# Patient Record
Sex: Male | Born: 1960 | Race: White | Hispanic: No | Marital: Married | Smoking: Current every day smoker
Health system: Southern US, Community
[De-identification: ages and names within clinical notes are randomized; demographics above are authoritative.]

## PROBLEM LIST (undated history)

## (undated) DIAGNOSIS — H579 Unspecified disorder of eye and adnexa: Secondary | ICD-10-CM

## (undated) DIAGNOSIS — I6529 Occlusion and stenosis of unspecified carotid artery: Secondary | ICD-10-CM

## (undated) DIAGNOSIS — R569 Unspecified convulsions: Secondary | ICD-10-CM

## (undated) DIAGNOSIS — E119 Type 2 diabetes mellitus without complications: Secondary | ICD-10-CM

## (undated) HISTORY — DX: Unspecified disorder of eye and adnexa: H57.9

## (undated) HISTORY — DX: Occlusion and stenosis of unspecified carotid artery: I65.29

## (undated) HISTORY — DX: Type 2 diabetes mellitus without complications: E11.9

## (undated) HISTORY — DX: Unspecified convulsions: R56.9

## (undated) HISTORY — PX: OTHER SURGICAL HISTORY: SHX169

---

## 2004-11-05 ENCOUNTER — Encounter: Admission: RE | Admit: 2004-11-05 | Discharge: 2004-11-05 | Payer: Self-pay | Admitting: Vascular Surgery

## 2015-02-07 ENCOUNTER — Other Ambulatory Visit: Payer: Self-pay | Admitting: Diagnostic Neuroimaging

## 2015-02-07 ENCOUNTER — Ambulatory Visit (INDEPENDENT_AMBULATORY_CARE_PROVIDER_SITE_OTHER): Payer: 59 | Admitting: Diagnostic Neuroimaging

## 2015-02-07 ENCOUNTER — Encounter: Payer: Self-pay | Admitting: Diagnostic Neuroimaging

## 2015-02-07 VITALS — BP 151/101 | HR 100 | Ht 76.0 in | Wt 194.6 lb

## 2015-02-07 DIAGNOSIS — R569 Unspecified convulsions: Secondary | ICD-10-CM | POA: Diagnosis not present

## 2015-02-07 DIAGNOSIS — Z139 Encounter for screening, unspecified: Secondary | ICD-10-CM

## 2015-02-07 MED ORDER — LEVETIRACETAM 500 MG PO TABS: 500.0000 mg | ORAL_TABLET | Freq: Two times a day (BID) | ORAL | Status: DC

## 2015-02-07 NOTE — Progress Notes (Signed)
[facility]  PATIENT: Michael Patel DOB: 1960-09-16  REFERRING CLINICIAN: Vyas HISTORY FROM: patient and wife  REASON FOR VISIT: new consult     HISTORICAL  CHIEF COMPLAINT:  Chief Complaint  Patient presents with  . New Evaluation    tonic clonic seizures     HISTORY OF PRESENT ILLNESS:   54 year old right-handed male here for evaluation of seizure. Patient has regular alcohol use, 2-3 drinks per night, 4-5 times per week for several years. He also uses lorazepam regularly for several years for anxiety.  01/26/15, patient was sitting at home, then reported he was "not feeling well" his wife. He does not recall saying this. He then had generalized shaking convulsions, was partially responsive, lasting for 5 minutes and then stop. Within a few minutes he had a second episode lasting 10 minutes of generalized convulsions. Patient had saliva and foam from the mouth, tongue biting, unresponsiveness. Afterwards he was confused and sleepy. Paramedics were called his home and took him to the hospital. Patient does not remember going to the hospital. He remembers waking up in the hospital and getting admitted.  Patient had CT of the head, CT imaging of the chest, blood testing, and was discharged on Dilantin 100 mg 3 times per day.  Since then patient has had no further events. Patient does have history of head trauma in junior high school, when he fell on concrete, loss of consciousness, admitted to the hospital for several days. No family history of seizure. No prior similar events. No CNS infections.  REVIEW OF SYSTEMS: Full 14 system review of systems performed and notable only for only as per history of present illness.   ALLERGIES: Allergies not on file  HOME MEDICATIONS: No outpatient prescriptions prior to visit.   No facility-administered medications prior to visit.    PAST MEDICAL HISTORY: Past Medical History  Diagnosis Date  . Other eye problems  influencing health status     r/t plaque breaking off of right ICA blockage  . Carotid artery calcification     right ICA 100% blockage  . Type 2 diabetes mellitus     controlled    PAST SURGICAL HISTORY: Past Surgical History  Procedure Laterality Date  . Left total hip replacement    . Left humorous fracture      FAMILY HISTORY: Family History  Problem Relation Age of Onset  . Diabetes type II Mother   . Diabetes Mellitus II Maternal Grandmother   . Diabetes Mellitus II Sister   . Cancer Mother   . Other Father     brain tumor    SOCIAL HISTORY:  History   Social History  . Marital Status: Married    Spouse Name: Burna Mortimer  . Number of Children: 1  . Years of Education: college   Occupational History  . unable to work    Social History Main Topics  . Smoking status: Current Every Day Smoker -- 1.00 packs/day    Types: Cigarettes  . Smokeless tobacco: Not on file  . Alcohol Use: 8.4 - 12.6 oz/week    14-21 Standard drinks or equivalent per week     Comment: 2-3 a night  . Drug Use: No  . Sexual Activity: Not on file   Other Topics Concern  . Not on file   Social History Narrative   Lives at home with his wife Burna Mortimer   Has one child from a previous marriage   Occasionally drinks coffee  PHYSICAL EXAM  Filed Vitals:   02/07/15 0921  Height: 6\' 4"  (1.93 m)  Weight: 194 lb 9.6 oz (88.27 kg)    Body mass index is 23.7 kg/(m^2).  No exam data present  No flowsheet data found.   GENERAL EXAM: Patient is in no distress; well developed, nourished and groomed; neck is supple; STRONG CIGARETTE SMOKE SMELL  CARDIOVASCULAR: Regular rate and rhythm, no murmurs, no carotid bruits  NEUROLOGIC: MENTAL STATUS: awake, alert, oriented to person, place and time, recent and remote memory intact, normal attention and concentration, language fluent, comprehension intact, naming intact, fund of knowledge appropriate CRANIAL NERVE: no papilledema on fundoscopic  exam, pupils equal and reactive to light, visual fields full to confrontation, extraocular muscles intact, no nystagmus, facial sensation and strength symmetric, hearing intact, palate elevates symmetrically, uvula midline, shoulder shrug symmetric, tongue midline. MOTOR: normal bulk and tone, full strength in the BUE, BLE; LEFT LEG WEAKNESS (LEFT HIP FLEX 1-2; LEFT KNEE FLEX/EXT 3) SENSORY: normal and symmetric to light touch, temperature, vibration  COORDINATION: finger-nose-finger, fine finger movements, heel-shin normal REFLEXES: deep tendon reflexes present and symmetric GAIT/STATION: narrow based gait; able to walk on toes, heels and tandem; romberg is negative    DIAGNOSTIC DATA (LABS, IMAGING, TESTING) - I reviewed patient records, labs, notes, testing and imaging myself where available.  No results found for: WBC, HGB, HCT, MCV, PLT No results found for: NA, K, CL, CO2, GLUCOSE, BUN, CREATININE, CALCIUM, PROT, ALBUMIN, AST, ALT, ALKPHOS, BILITOT, GFRNONAA, GFRAA No results found for: CHOL, HDL, LDLCALC, LDLDIRECT, TRIG, CHOLHDL No results found for: [id number] No results found for: VITAMINB12 No results found for: TSH   Labs [facility]): EtOH <10, salicylate < 3, tylenol < 5, D-dimer 0.88  01/26/15 CT head - no acute findings; mild atrophy; right frontal cortical hypodensity (? Chronic ischemic gliosis) [I reviewed images myself, and these are my findings. -VRP]    ASSESSMENT AND PLAN  54 y.o. year old male here with  New onset seizure x 2 in Jan 26, 2015. No clear triggering factors. Will complete workup. Also will transition dilantin to newer generation anti-sz med (levetiracetam) with less side effects and drug interactions.   Ddx: seizure disorder, stroke, neoplastic, medication/ETOH associated  PLAN: - MRI brain  - EEG - transition dilantin to levetiracetam  Patient Instructions  Start levetiracetam 500mg  twice a day. Continue this medication going forward.  After  1 week, reduce phenytoin to 100mg  twice a day.  After 1 more week, reduce phenytoin to 100mg  once a day.  After 1 more week, stop phenytoin.    Orders Placed This Encounter  Procedures  . MR Brain W Wo Contrast  . EEG adult   Meds ordered this encounter  Medications  . DISCONTD: DILANTIN 100 MG ER capsule    Sig:   . levETIRAcetam (KEPPRA) 500 MG tablet    Sig: Take 1 tablet (500 mg total) by mouth 2 (two) times daily.    Dispense:  60 tablet    Refill:  12   Return in about 3 months (around 05/10/2015).  I reviewed images, labs, notes, records myself. I summarized findings and reviewed with patient, for this high risk condition (new onset seizure disorder) requiring high complexity decision making.   Suanne MarkerVIKRAM R. Coletta Lockner, MD 02/07/2015, 10:33 AM Certified in Neurology, Neurophysiology and Neuroimaging  [facility] [address], [address] [place], [place] [address] [phone]

## 2015-02-07 NOTE — Patient Instructions (Signed)
Start levetiracetam 500mg  twice a day. Continue this medication going forward.  After 1 week, reduce phenytoin to 100mg  twice a day.  After 1 more week, reduce phenytoin to 100mg  once a day.  After 1 more week, stop phenytoin.

## 2015-02-14 ENCOUNTER — Ambulatory Visit (INDEPENDENT_AMBULATORY_CARE_PROVIDER_SITE_OTHER): Payer: 59 | Admitting: Diagnostic Neuroimaging

## 2015-02-14 DIAGNOSIS — R569 Unspecified convulsions: Secondary | ICD-10-CM

## 2015-02-15 NOTE — Procedures (Signed)
   [facility]  EEG (ELECTROENCEPHALOGRAM) REPORT   STUDY DATE: 02/14/15 PATIENT NAME: Michael Patel DOB: [birth year] MRN: [id number]  ORDERING CLINICIAN: Joycelyn SchmidVikram Shali Vesey, MD   TECHNOLOGIST: Gearldine ShownLorraine Jones  TECHNIQUE: Electroencephalogram was recorded utilizing standard 10-20 system of lead placement and reformatted into average and bipolar montages.  RECORDING TIME: 35 minutes ACTIVATION: hyperventilation and photic stimulation  CLINICAL INFORMATION: 54 year old male with new onset seizure.  FINDINGS: Background rhythms of 8-9 hertz and 30-40 microvolts. No focal, lateralizing, epileptiform activity or seizures are seen. Patient recorded in the awake state.   IMPRESSION:  Normal EEG in the awake state.    INTERPRETING PHYSICIAN:  Suanne MarkerVIKRAM R. Glendy Barsanti, MD Certified in Neurology, Neurophysiology and Neuroimaging  [facility] [address], [address] [place], [place] [address] [phone]

## 2015-02-17 ENCOUNTER — Telehealth: Payer: Self-pay | Admitting: *Deleted

## 2015-02-17 NOTE — Telephone Encounter (Signed)
Spoke to the pt on the phone and informed him that his EEG was normal. He stated an understanding and a thanks

## 2015-02-20 ENCOUNTER — Ambulatory Visit
Admission: RE | Admit: 2015-02-20 | Discharge: 2015-02-20 | Disposition: A | Discharge status: Home / Self Care | Payer: 59 | Source: Ambulatory Visit | Attending: Diagnostic Neuroimaging | Admitting: Diagnostic Neuroimaging

## 2015-02-20 DIAGNOSIS — Z139 Encounter for screening, unspecified: Secondary | ICD-10-CM

## 2015-02-20 DIAGNOSIS — R569 Unspecified convulsions: Secondary | ICD-10-CM | POA: Diagnosis not present

## 2015-02-20 MED ORDER — GADOBENATE DIMEGLUMINE 529 MG/ML IV SOLN: 18.0000 mL | Freq: Once | INTRAVENOUS | Status: AC | PRN

## 2015-05-10 ENCOUNTER — Ambulatory Visit (INDEPENDENT_AMBULATORY_CARE_PROVIDER_SITE_OTHER): Payer: 59 | Admitting: Diagnostic Neuroimaging

## 2015-05-10 ENCOUNTER — Encounter: Payer: Self-pay | Admitting: Diagnostic Neuroimaging

## 2015-05-10 VITALS — BP 132/78 | HR 117 | Ht 76.0 in | Wt 197.4 lb

## 2015-05-10 DIAGNOSIS — R569 Unspecified convulsions: Secondary | ICD-10-CM

## 2015-05-10 MED ORDER — LEVETIRACETAM 500 MG PO TABS: 500.0000 mg | ORAL_TABLET | Freq: Two times a day (BID) | ORAL | Status: AC

## 2015-05-10 NOTE — Progress Notes (Signed)
[facility]  PATIENT: Michael Patel DOB: [birth year]  REFERRING CLINICIAN: Vyas HISTORY FROM: patient and wife  REASON FOR VISIT: follow up    HISTORICAL  CHIEF COMPLAINT:  Chief Complaint  Patient presents with  . Convulsions    rm 6  . Follow-up    3 month    HISTORY OF PRESENT ILLNESS:   UPDATE 05/10/15: Since last visit, no seizures. Tolerating LEV 500mg  BID. Off dilantin. Still smoking and drinking etoh on daily basis.  PRIOR HPI (02/07/15): 54 year old right-handed male here for evaluation of seizure. Patient has regular alcohol use, 2-3 drinks per night, 4-5 times per week for several years. He also uses lorazepam regularly for several years for anxiety. 01/26/15, patient was sitting at home, then reported he was "not feeling well" his wife. He does not recall saying this. He then had generalized shaking convulsions, was partially responsive, lasting for 5 minutes and then stop. Within a few minutes he had a second episode lasting 10 minutes of generalized convulsions. Patient had saliva and foam from the mouth, tongue biting, unresponsiveness. Afterwards he was confused and sleepy. Paramedics were called his home and took him to the hospital. Patient does not remember going to the hospital. He remembers waking up in the hospital and getting admitted. Patient had CT of the head, CT imaging of the chest, blood testing, and was discharged on Dilantin 100 mg 3 times per day. Since then patient has had no further events. Patient does have history of head trauma in junior high school, when he fell on concrete, loss of consciousness, admitted to the hospital for several days. No family history of seizure. No prior similar events. No CNS infections.  REVIEW OF SYSTEMS: Full 14 system review of systems performed and notable only for only as per history of present illness.   ALLERGIES: No Known Allergies  HOME MEDICATIONS: Outpatient Prescriptions Prior to Visit    Medication Sig Dispense Refill  . aspirin 81 MG tablet Take 81 mg by mouth daily.    . benazepril (LOTENSIN) 40 MG tablet Take 40 mg by mouth daily.    . citalopram (CELEXA) 10 MG tablet     . LORazepam (ATIVAN) 1 MG tablet     . omeprazole (PRILOSEC) 20 MG capsule     . levETIRAcetam (KEPPRA) 500 MG tablet Take 1 tablet (500 mg total) by mouth 2 (two) times daily. 60 tablet 12  . NORCO 7.5-325 MG per tablet      No facility-administered medications prior to visit.    PAST MEDICAL HISTORY: Past Medical History  Diagnosis Date  . Other eye problems influencing health status     r/t plaque breaking off of right ICA blockage  . Carotid artery calcification     right ICA 100% blockage  . Type 2 diabetes mellitus     controlled  . Seizures     01/26/15    PAST SURGICAL HISTORY: Past Surgical History  Procedure Laterality Date  . Left total hip replacement    . Left humorous fracture      FAMILY HISTORY: Family History  Problem Relation Age of Onset  . Diabetes type II Mother   . Cancer Mother   . Diabetes Mellitus II Maternal Grandmother   . Diabetes Mellitus II Sister   . Cancer Sister   . Other Father     brain tumor    SOCIAL HISTORY:  Social History   Social History  . Marital Status: Married  Spouse Name: Burna Mortimer  . Number of Children: 1  . Years of Education: college   Occupational History  . unable to work    Social History Main Topics  . Smoking status: Current Every Day Smoker -- 1.00 packs/day for 41 years    Types: Cigarettes  . Smokeless tobacco: Not on file  . Alcohol Use: 8.4 - 12.6 oz/week    14-21 Standard drinks or equivalent per week     Comment: 2-3 a night  . Drug Use: No  . Sexual Activity: Not on file   Other Topics Concern  . Not on file   Social History Narrative   Lives at home with his wife Burna Mortimer   Has one child from a previous marriage   Occasionally drinks coffee     PHYSICAL EXAM  Filed Vitals:   05/10/15 1008   BP: 132/78  Pulse: 117  Height: 6\' 4"  (1.93 m)  Weight: 197 lb 6.4 oz (89.54 kg)    Body mass index is 24.04 kg/(m^2).  No exam data present  No flowsheet data found.   GENERAL EXAM: Patient is in no distress; well developed, nourished and groomed; neck is supple; STRONG CIGARETTE SMOKE SMELL  CARDIOVASCULAR: Regular rate and rhythm, no murmurs, no carotid bruits  NEUROLOGIC: MENTAL STATUS: awake, alert, language fluent, comprehension intact, naming intact, fund of knowledge appropriate CRANIAL NERVE: pupils equal and reactive to light, visual fields full to confrontation, extraocular muscles intact, no nystagmus, facial sensation and strength symmetric, hearing intact, palate elevates symmetrically, uvula midline, shoulder shrug symmetric, tongue midline. MOTOR: normal bulk and tone, full strength in the BUE, BLE; LEFT LEG WEAKNESS (LEFT HIP FLEX 1-2; LEFT KNEE FLEX/EXT 3) SENSORY: normal and symmetric to light touch, temperature, vibration  COORDINATION: finger-nose-finger, fine finger movements, heel-shin normal REFLEXES: deep tendon reflexes present and symmetric GAIT/STATION: LEFT HEMIPARETIC GAIT    DIAGNOSTIC DATA (LABS, IMAGING, TESTING) - I reviewed patient records, labs, notes, testing and imaging myself where available.  No results found for: WBC, HGB, HCT, MCV, PLT No results found for: NA, K, CL, CO2, GLUCOSE, BUN, CREATININE, CALCIUM, PROT, ALBUMIN, AST, ALT, ALKPHOS, BILITOT, GFRNONAA, GFRAA No results found for: CHOL, HDL, LDLCALC, LDLDIRECT, TRIG, CHOLHDL No results found for: [id number] No results found for: VITAMINB12 No results found for: TSH   Labs [facility]): EtOH <10, salicylate < 3, tylenol < 5, D-dimer 0.88  01/26/15 CT head - no acute findings; mild atrophy; right frontal cortical hypodensity (? Chronic ischemic gliosis) [I reviewed images myself, and these are my findings. -VRP]  02/20/15 MRI of the brain with and without contrast show the  following: 1. Remote right parietal stroke associated with encephalomalacia and mild enlargement of the posterior horn of the right lateral ventricle. This stroke is potentially in a watershed distribution. 2. There appears to be occlusion of the right internal carotid artery with no flow void noted in the right carotid siphon.  3. Mild generalized cortical atrophy and minimal chronic small vessel ischemic changes.    ASSESSMENT AND PLAN  54 y.o. year old male here with new onset seizure x 2 in Jan 26, 2015. Likely related to prior stroke and current etoh abuse. Now stable on LEV 500mg  BID.  Dx: seizure disorder (post-stroke)  PLAN: - continue levetiracetam - smoking cessation and ETOH cessation reviewed  Patient Instructions  Continue current medications.   Meds ordered this encounter  Medications  . levETIRAcetam (KEPPRA) 500 MG tablet    Sig: Take 1 tablet (500  mg total) by mouth 2 (two) times daily.    Dispense:  180 tablet    Refill:  4   Return in about 6 months (around 11/07/2015).    [name], MD 05/10/2015, 10:31 AM Certified in Neurology, Neurophysiology and Neuroimaging  [facility] [address], [address] [place], [place] [address] [phone]

## 2015-05-10 NOTE — Patient Instructions (Signed)
Continue current medications. 

## 2015-11-06 ENCOUNTER — Ambulatory Visit: Payer: 59 | Admitting: Diagnostic Neuroimaging

## 2016-05-03 DIAGNOSIS — Z139 Encounter for screening, unspecified: Secondary | ICD-10-CM

## 2016-06-26 ENCOUNTER — Telehealth: Payer: Self-pay | Admitting: Diagnostic Neuroimaging

## 2016-06-26 NOTE — Telephone Encounter (Signed)
Wife called to check status of [facility]. Please call to advise.

## 2016-07-16 ENCOUNTER — Ambulatory Visit: Payer: 59 | Admitting: Diagnostic Neuroimaging

## 2016-07-17 ENCOUNTER — Encounter: Payer: Self-pay | Admitting: Diagnostic Neuroimaging

## 2016-09-24 ENCOUNTER — Inpatient Hospital Stay (HOSPITAL_COMMUNITY)
Admission: EM | Admit: 2016-09-24 | Discharge: 2016-10-10 | DRG: 871 | Disposition: E | Payer: Self-pay | Attending: Pulmonary Disease | Admitting: Pulmonary Disease

## 2016-09-24 ENCOUNTER — Encounter (HOSPITAL_COMMUNITY): Payer: Self-pay

## 2016-09-24 DIAGNOSIS — J189 Pneumonia, unspecified organism: Secondary | ICD-10-CM | POA: Diagnosis present

## 2016-09-24 DIAGNOSIS — K56600 Partial intestinal obstruction, unspecified as to cause: Secondary | ICD-10-CM | POA: Diagnosis not present

## 2016-09-24 DIAGNOSIS — T17918A Gastric contents in respiratory tract, part unspecified causing other injury, initial encounter: Secondary | ICD-10-CM | POA: Diagnosis present

## 2016-09-24 DIAGNOSIS — F329 Major depressive disorder, single episode, unspecified: Secondary | ICD-10-CM | POA: Diagnosis present

## 2016-09-24 DIAGNOSIS — K529 Noninfective gastroenteritis and colitis, unspecified: Secondary | ICD-10-CM | POA: Diagnosis present

## 2016-09-24 DIAGNOSIS — R188 Other ascites: Secondary | ICD-10-CM | POA: Diagnosis present

## 2016-09-24 DIAGNOSIS — T17908A Unspecified foreign body in respiratory tract, part unspecified causing other injury, initial encounter: Secondary | ICD-10-CM | POA: Diagnosis present

## 2016-09-24 DIAGNOSIS — R4182 Altered mental status, unspecified: Secondary | ICD-10-CM | POA: Diagnosis present

## 2016-09-24 DIAGNOSIS — Z79891 Long term (current) use of opiate analgesic: Secondary | ICD-10-CM

## 2016-09-24 DIAGNOSIS — I6521 Occlusion and stenosis of right carotid artery: Secondary | ICD-10-CM | POA: Diagnosis present

## 2016-09-24 DIAGNOSIS — R6521 Severe sepsis with septic shock: Secondary | ICD-10-CM | POA: Diagnosis not present

## 2016-09-24 DIAGNOSIS — D696 Thrombocytopenia, unspecified: Secondary | ICD-10-CM | POA: Diagnosis present

## 2016-09-24 DIAGNOSIS — G40909 Epilepsy, unspecified, not intractable, without status epilepticus: Secondary | ICD-10-CM

## 2016-09-24 DIAGNOSIS — K56609 Unspecified intestinal obstruction, unspecified as to partial versus complete obstruction: Secondary | ICD-10-CM

## 2016-09-24 DIAGNOSIS — I2510 Atherosclerotic heart disease of native coronary artery without angina pectoris: Secondary | ICD-10-CM | POA: Diagnosis present

## 2016-09-24 DIAGNOSIS — F1011 Alcohol abuse, in remission: Secondary | ICD-10-CM | POA: Diagnosis present

## 2016-09-24 DIAGNOSIS — Z7951 Long term (current) use of inhaled steroids: Secondary | ICD-10-CM

## 2016-09-24 DIAGNOSIS — D61818 Other pancytopenia: Secondary | ICD-10-CM | POA: Diagnosis present

## 2016-09-24 DIAGNOSIS — I959 Hypotension, unspecified: Secondary | ICD-10-CM | POA: Diagnosis present

## 2016-09-24 DIAGNOSIS — R1110 Vomiting, unspecified: Secondary | ICD-10-CM

## 2016-09-24 DIAGNOSIS — Y92230 Patient room in hospital as the place of occurrence of the external cause: Secondary | ICD-10-CM | POA: Diagnosis not present

## 2016-09-24 DIAGNOSIS — J440 Chronic obstructive pulmonary disease with acute lower respiratory infection: Secondary | ICD-10-CM | POA: Diagnosis present

## 2016-09-24 DIAGNOSIS — Z7982 Long term (current) use of aspirin: Secondary | ICD-10-CM

## 2016-09-24 DIAGNOSIS — Z8781 Personal history of (healed) traumatic fracture: Secondary | ICD-10-CM

## 2016-09-24 DIAGNOSIS — Z885 Allergy status to narcotic agent status: Secondary | ICD-10-CM

## 2016-09-24 DIAGNOSIS — R0603 Acute respiratory distress: Secondary | ICD-10-CM | POA: Diagnosis present

## 2016-09-24 DIAGNOSIS — I69359 Hemiplegia and hemiparesis following cerebral infarction affecting unspecified side: Secondary | ICD-10-CM

## 2016-09-24 DIAGNOSIS — K709 Alcoholic liver disease, unspecified: Secondary | ICD-10-CM | POA: Diagnosis present

## 2016-09-24 DIAGNOSIS — Z803 Family history of malignant neoplasm of breast: Secondary | ICD-10-CM

## 2016-09-24 DIAGNOSIS — Z808 Family history of malignant neoplasm of other organs or systems: Secondary | ICD-10-CM

## 2016-09-24 DIAGNOSIS — R7989 Other specified abnormal findings of blood chemistry: Secondary | ICD-10-CM

## 2016-09-24 DIAGNOSIS — G9341 Metabolic encephalopathy: Secondary | ICD-10-CM | POA: Diagnosis present

## 2016-09-24 DIAGNOSIS — I468 Cardiac arrest due to other underlying condition: Secondary | ICD-10-CM | POA: Diagnosis not present

## 2016-09-24 DIAGNOSIS — D539 Nutritional anemia, unspecified: Secondary | ICD-10-CM | POA: Diagnosis present

## 2016-09-24 DIAGNOSIS — T501X5A Adverse effect of loop [high-ceiling] diuretics, initial encounter: Secondary | ICD-10-CM | POA: Diagnosis not present

## 2016-09-24 DIAGNOSIS — Z978 Presence of other specified devices: Secondary | ICD-10-CM

## 2016-09-24 DIAGNOSIS — Z801 Family history of malignant neoplasm of trachea, bronchus and lung: Secondary | ICD-10-CM

## 2016-09-24 DIAGNOSIS — R0902 Hypoxemia: Secondary | ICD-10-CM

## 2016-09-24 DIAGNOSIS — J181 Lobar pneumonia, unspecified organism: Secondary | ICD-10-CM

## 2016-09-24 DIAGNOSIS — A419 Sepsis, unspecified organism: Principal | ICD-10-CM | POA: Diagnosis present

## 2016-09-24 DIAGNOSIS — K567 Ileus, unspecified: Secondary | ICD-10-CM

## 2016-09-24 DIAGNOSIS — N179 Acute kidney failure, unspecified: Secondary | ICD-10-CM | POA: Diagnosis not present

## 2016-09-24 DIAGNOSIS — R400 Somnolence: Secondary | ICD-10-CM | POA: Diagnosis present

## 2016-09-24 DIAGNOSIS — Z993 Dependence on wheelchair: Secondary | ICD-10-CM

## 2016-09-24 DIAGNOSIS — T82534A Leakage of infusion catheter, initial encounter: Secondary | ICD-10-CM

## 2016-09-24 DIAGNOSIS — R34 Anuria and oliguria: Secondary | ICD-10-CM

## 2016-09-24 DIAGNOSIS — Z96642 Presence of left artificial hip joint: Secondary | ICD-10-CM | POA: Diagnosis present

## 2016-09-24 DIAGNOSIS — L8990 Pressure ulcer of unspecified site, unspecified stage: Secondary | ICD-10-CM | POA: Diagnosis present

## 2016-09-24 DIAGNOSIS — F17210 Nicotine dependence, cigarettes, uncomplicated: Secondary | ICD-10-CM | POA: Diagnosis present

## 2016-09-24 DIAGNOSIS — Z66 Do not resuscitate: Secondary | ICD-10-CM | POA: Diagnosis not present

## 2016-09-24 DIAGNOSIS — I10 Essential (primary) hypertension: Secondary | ICD-10-CM | POA: Diagnosis present

## 2016-09-24 DIAGNOSIS — I469 Cardiac arrest, cause unspecified: Secondary | ICD-10-CM | POA: Diagnosis present

## 2016-09-24 DIAGNOSIS — K760 Fatty (change of) liver, not elsewhere classified: Secondary | ICD-10-CM | POA: Diagnosis present

## 2016-09-24 DIAGNOSIS — R17 Unspecified jaundice: Secondary | ICD-10-CM | POA: Diagnosis present

## 2016-09-24 DIAGNOSIS — I69354 Hemiplegia and hemiparesis following cerebral infarction affecting left non-dominant side: Secondary | ICD-10-CM

## 2016-09-24 DIAGNOSIS — E872 Acidosis: Secondary | ICD-10-CM | POA: Diagnosis present

## 2016-09-24 DIAGNOSIS — R532 Functional quadriplegia: Secondary | ICD-10-CM | POA: Diagnosis not present

## 2016-09-24 DIAGNOSIS — Z515 Encounter for palliative care: Secondary | ICD-10-CM | POA: Diagnosis not present

## 2016-09-24 DIAGNOSIS — E876 Hypokalemia: Secondary | ICD-10-CM | POA: Diagnosis present

## 2016-09-24 DIAGNOSIS — Z833 Family history of diabetes mellitus: Secondary | ICD-10-CM

## 2016-09-24 DIAGNOSIS — Z791 Long term (current) use of non-steroidal anti-inflammatories (NSAID): Secondary | ICD-10-CM

## 2016-09-24 DIAGNOSIS — R0600 Dyspnea, unspecified: Secondary | ICD-10-CM

## 2016-09-24 NOTE — ED Provider Notes (Signed)
By signing my name below, I, [name], attest that this documentation has been prepared under the direction and in the presence of [name] N [name], DO. Electronically Signed: [name], ED Scribe. 09/25/16. 12:11 AM.  TIME SEEN: 12:03 AM  CHIEF COMPLAINT: No chief complaint on file.  HPI:  HPI Comments: Michael Patel is a 56 y.o. male with h/o DM no longer on medication and seizures on Keppra, who presents to the Emergency Department by EMS complaining of generalized weakness onset three days ago. Wife reports that pt started vomiting and has had multiple episodes of diarrhea; she also states he has not been able to "operate his hands". She also reports that pt has been confused and not responding to her; she is concerned he had a stroke. Pt was last seen normal by wife one week ago. Wife denies recent injury or trauma. EMS was called earlier but pt refused transport. EMS reports that pt appears to have deteriorated since they were last called out. Pt is not on oxygen at home. Pt regularly drinks alcohol, no drug use. Wife reports no alcohol the past 3 days. No witnessed seizures. Normally he is able to talk normally. He does not ambulate and has not walked into years. Pt denies fever, chills, headache, chest pain, abdominal pain, headache. He denies any pain at this time.   ROS: See HPI, level V caveat for altered mental status Constitutional: no fever  Eyes: no drainage  ENT: no runny nose   Cardiovascular:  no chest pain  Resp: no SOB  GI:  vomiting GU: no dysuria Integumentary: no rash  Allergy: no hives  Musculoskeletal: no leg swelling  Neurological: no slurred speech ROS otherwise negative  PAST MEDICAL HISTORY/PAST SURGICAL HISTORY:  Past Medical History:  Diagnosis Date  . 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 Seizures (HCC)    01/26/15  . Type 2 diabetes  mellitus (HCC)    controlled    MEDICATIONS:  Prior to Admission medications   Medication Sig Start Date End Date Taking? Authorizing Provider  aspirin 81 MG tablet Take 81 mg by mouth daily.    Historical Provider, MD  benazepril (LOTENSIN) 40 MG tablet Take 40 mg by mouth daily.    Historical Provider, MD  citalopram (CELEXA) 10 MG tablet  01/27/15   Historical Provider, MD  HYDROcodone-acetaminophen (NORCO) 10-325 MG per tablet Take 1 tablet by mouth every 6 (six) hours as needed.    Historical Provider, MD  levETIRAcetam (KEPPRA) 500 MG tablet Take 1 tablet (500 mg total) by mouth 2 (two) times daily. 05/10/15   [name], MD  LORazepam (ATIVAN) 1 MG tablet  01/27/15   Historical Provider, MD  omeprazole (PRILOSEC) 20 MG capsule  12/17/14   Historical Provider, MD    ALLERGIES:  No Known Allergies  SOCIAL HISTORY:  Social History  Substance Use Topics  . Smoking status: Current Every Day Smoker    Packs/day: 1.00    Years: 41.00    Types: Cigarettes  . Smokeless tobacco: Never Used  . Alcohol use 8.4 - 12.6 oz/week    14 - 21 Standard drinks or equivalent per week     Comment: 2-3 a night    FAMILY HISTORY: Family History  Problem Relation Age of Onset  . Diabetes type II Mother   . Cancer Mother   .  Diabetes Mellitus II Sister   . Cancer Sister   . Other Father     brain tumor  . Diabetes Mellitus II Maternal Grandmother    EXAM: BP (!) 125/103 (BP Location: Left Arm)   Pulse 95   Temp 98.1 F (36.7 C) (Oral)   Resp (!) 27   SpO2 100%  CONSTITUTIONAL: Oriented to person and place but not time. Slow to answer questions. Chronically ill-appearing. Appears older than stated age. HEAD: Normocephalic EYES: Conjunctivae clear, PERRL, EOMI ENT: normal nose; no rhinorrhea; Dry mucous membranes NECK: Supple, no meningismus, no nuchal rigidity, no LAD  CARD: RRR; S1 and S2 appreciated; no murmurs, no clicks, no rubs, no gallops RESP: Normal chest excursion  without splinting or tachypnea; breath sounds clear and equal bilaterally; no wheezes, no rhonchi, no rales, no hypoxia or respiratory distress, speaking full sentences ABD/GI: Normal bowel sounds; non-distended; soft, non-tender, no rebound, no guarding, no peritoneal signs, no hepatosplenomegaly BACK:  The back appears normal and is non-tender to palpation, there is no CVA tenderness EXT: Normal ROM in all joints; non-tender to palpation; no edema; normal capillary refill; no cyanosis, no calf tenderness or swelling    SKIN: Normal color for age and race; warm; no rash, multiple abrasions to bilateral upper extremities without signs of superimposed infection NEURO: Moves all extremities equally, does not answer questions appropriately, slow to answer questions and speaks very quietly, mild dysarthria, unable to test sensation, cranial nerves. Unable to lift his arms or legs or the bed.   MEDICAL DECISION MAKING: Patient here with altered mental status for the past week. Concern for stroke, intracranial hemorrhage, infection, dehydration, hepatic encephalopathy. Will obtain labs, urine, chest x-ray, head CT. EKG shows no ischemic abnormality. We'll give IV fluids as he does appear dry on exam.  ED PROGRESS: 1:10 AM  Pt's labs show mildly elevated lactate at 2.5. He is getting IV hydration. Urine shows no obvious sign of infection. He does appear to have a left lower lobe pneumonia. Has new oxygen requirement here. Rectal temperature of 100.2. We'll give ceftriaxone, azithromycin for community-acquired pneumonia. We'll also send flu swab.  3:00 AM  Pt's head CT shows no acute abnormality. Possible remote infarct seen. Family denies he has ever had a history of stroke. Still no focal neurologic deficits on exam but is still confused. Ammonia is 36 and unlikely the cause of his symptoms. Discussed with Dr. Maryfrances Bunnellanford with hospital service who will see the patient for admission. He will place holding orders.  Patient and family have been updated with this plan.  I reviewed all nursing notes, vitals, pertinent old records, EKGs, labs, imaging (as available).    EKG Interpretation  Date/Time:  Wednesday September 25 2016 00:25:58 EST Ventricular Rate:  96 PR Interval:    QRS Duration: 98 QT Interval:  341 QTC Calculation: 431 R Axis:   8 Text Interpretation:  Sinus rhythm Probable left atrial enlargement Borderline repolarization abnormality No old tracing to compare Confirmed by Ogden Handlin,  DO, Vyom Brass ([id number]) on 09/25/2016 12:33:56 AM        I personally performed the services described in this documentation, which was scribed in my presence. The recorded information has been reviewed and is accurate.     Layla MawKristen N Pegge Cumberledge, DO 09/25/16 [phone]

## 2016-09-24 NOTE — ED Triage Notes (Signed)
EMS report pt has had increased generalized weakness x several days. PT has refused transport x 2 days but decided to come in today.EMS report pt appears to have deteriorated in 2 days  PT wife states "he had spinal tap after hip surgery and hasn't been able to walk good since. He don't walk good"  PT A&O at this time but does have episodes of confusion.   20 LAC 500cc NS   bp- 146/94 Hr-92, rr22, CBG 189

## 2016-09-25 ENCOUNTER — Emergency Department (HOSPITAL_COMMUNITY): Payer: Self-pay

## 2016-09-25 ENCOUNTER — Inpatient Hospital Stay (HOSPITAL_COMMUNITY): Payer: Self-pay

## 2016-09-25 ENCOUNTER — Encounter (HOSPITAL_COMMUNITY): Payer: Self-pay | Admitting: Family Medicine

## 2016-09-25 DIAGNOSIS — R400 Somnolence: Secondary | ICD-10-CM | POA: Diagnosis present

## 2016-09-25 DIAGNOSIS — G40909 Epilepsy, unspecified, not intractable, without status epilepticus: Secondary | ICD-10-CM

## 2016-09-25 DIAGNOSIS — E876 Hypokalemia: Secondary | ICD-10-CM | POA: Diagnosis present

## 2016-09-25 DIAGNOSIS — J189 Pneumonia, unspecified organism: Secondary | ICD-10-CM | POA: Diagnosis present

## 2016-09-25 DIAGNOSIS — R17 Unspecified jaundice: Secondary | ICD-10-CM | POA: Diagnosis present

## 2016-09-25 DIAGNOSIS — I10 Essential (primary) hypertension: Secondary | ICD-10-CM | POA: Diagnosis present

## 2016-09-25 DIAGNOSIS — D696 Thrombocytopenia, unspecified: Secondary | ICD-10-CM

## 2016-09-25 DIAGNOSIS — A419 Sepsis, unspecified organism: Secondary | ICD-10-CM | POA: Diagnosis present

## 2016-09-25 DIAGNOSIS — J181 Lobar pneumonia, unspecified organism: Secondary | ICD-10-CM

## 2016-09-25 LAB — COMPREHENSIVE METABOLIC PANEL
ALBUMIN: 3 g/dL — AB (ref 3.5–5.0)
ALK PHOS: 50 U/L (ref 38–126)
ALT: 15 U/L — ABNORMAL LOW (ref 17–63)
ANION GAP: 16 — AB (ref 5–15)
AST: 29 U/L (ref 15–41)
BUN: 42 mg/dL — ABNORMAL HIGH (ref 6–20)
CALCIUM: 9.3 mg/dL (ref 8.9–10.3)
CO2: 25 mmol/L (ref 22–32)
Chloride: 97 mmol/L — ABNORMAL LOW (ref 101–111)
Creatinine, Ser: 1.06 mg/dL (ref 0.61–1.24)
GFR calc Af Amer: >60 mL/min (ref >60)
GFR calc non Af Amer: >60 mL/min (ref >60)
Glucose, Bld: 148 mg/dL — ABNORMAL HIGH (ref 65–99)
POTASSIUM: 3.2 mmol/L — AB (ref 3.5–5.1)
SODIUM: 138 mmol/L (ref 135–145)
TOTAL PROTEIN: 7.9 g/dL (ref 6.5–8.1)
Total Bilirubin: 2.3 mg/dL — ABNORMAL HIGH (ref 0.3–1.2)

## 2016-09-25 LAB — CBC WITH DIFFERENTIAL/PLATELET
BASOS PCT: 0 %
Basophils Absolute: 0 10*3/uL (ref 0.0–0.1)
EOS ABS: 0 10*3/uL (ref 0.0–0.7)
EOS PCT: 0 %
HCT: 36.6 % — ABNORMAL LOW (ref 39.0–52.0)
HEMOGLOBIN: 12.3 g/dL — AB (ref 13.0–17.0)
LYMPHS ABS: 0.7 10*3/uL (ref 0.7–4.0)
Lymphocytes Relative: 12 %
MCH: 34.6 pg — AB (ref 26.0–34.0)
MCHC: 33.6 g/dL (ref 30.0–36.0)
MCV: 102.8 fL — ABNORMAL HIGH (ref 78.0–100.0)
MONO ABS: 0.3 10*3/uL (ref 0.1–1.0)
MONOS PCT: 5 %
Neutro Abs: 4.9 10*3/uL (ref 1.7–7.7)
Neutrophils Relative %: 83 %
PLATELETS: 125 10*3/uL — AB (ref 150–400)
RBC: 3.56 MIL/uL — ABNORMAL LOW (ref 4.22–5.81)
RDW: 15.3 % (ref 11.5–15.5)
WBC: 5.8 10*3/uL (ref 4.0–10.5)

## 2016-09-25 LAB — RESPIRATORY PANEL BY PCR
Adenovirus: NOT DETECTED
Bordetella pertussis: NOT DETECTED
CORONAVIRUS 229E-RVPPCR: NOT DETECTED
Chlamydophila pneumoniae: NOT DETECTED
Coronavirus HKU1: NOT DETECTED
Coronavirus NL63: NOT DETECTED
Coronavirus OC43: NOT DETECTED
INFLUENZA B-RVPPCR: NOT DETECTED
Influenza A: NOT DETECTED
MYCOPLASMA PNEUMONIAE-RVPPCR: NOT DETECTED
Metapneumovirus: NOT DETECTED
Parainfluenza Virus 1: NOT DETECTED
Parainfluenza Virus 2: NOT DETECTED
Parainfluenza Virus 3: NOT DETECTED
Parainfluenza Virus 4: NOT DETECTED
RESPIRATORY SYNCYTIAL VIRUS-RVPPCR: NOT DETECTED
Rhinovirus / Enterovirus: NOT DETECTED

## 2016-09-25 LAB — LACTIC ACID, PLASMA
LACTIC ACID, VENOUS: 1.4 mmol/L (ref 0.5–1.9)
LACTIC ACID, VENOUS: 1.9 mmol/L (ref 0.5–1.9)
Lactic Acid, Venous: 2.2 mmol/L (ref 0.5–1.9)
Lactic Acid, Venous: 1.7 mmol/L (ref 0.5–1.9)

## 2016-09-25 LAB — I-STAT ARTERIAL BLOOD GAS, ED
ACID-BASE DEFICIT: 1 mmol/L (ref 0.0–2.0)
Bicarbonate: 22.4 mmol/L (ref 20.0–28.0)
O2 SAT: 99 %
PH ART: 7.458 — AB (ref 7.350–7.450)
Patient temperature: 98.6
TCO2: 23 mmol/L (ref 0–100)
pCO2 arterial: 31.7 mmHg — ABNORMAL LOW (ref 32.0–48.0)
pO2, Arterial: 117 mmHg — ABNORMAL HIGH (ref 83.0–108.0)

## 2016-09-25 LAB — URINALYSIS, ROUTINE W REFLEX MICROSCOPIC
BACTERIA UA: NONE SEEN
Glucose, UA: NEGATIVE mg/dL
Hgb urine dipstick: NEGATIVE
Ketones, ur: 5 mg/dL — AB
Leukocytes, UA: NEGATIVE
NITRITE: NEGATIVE
PH: 5 (ref 5.0–8.0)
Protein, ur: 30 mg/dL — AB
SPECIFIC GRAVITY, URINE: 1.027 (ref 1.005–1.030)
SQUAMOUS EPITHELIAL / LPF: NONE SEEN

## 2016-09-25 LAB — I-STAT TROPONIN, ED: TROPONIN I, POC: 0 ng/mL (ref 0.00–0.08)

## 2016-09-25 LAB — RAPID URINE DRUG SCREEN, HOSP PERFORMED
Amphetamines: NOT DETECTED
BENZODIAZEPINES: POSITIVE — AB
Barbiturates: NOT DETECTED
Cocaine: NOT DETECTED
Opiates: NOT DETECTED
Tetrahydrocannabinol: NOT DETECTED

## 2016-09-25 LAB — GLUCOSE, CAPILLARY
GLUCOSE-CAPILLARY: 146 mg/dL — AB (ref 65–99)
GLUCOSE-CAPILLARY: 152 mg/dL — AB (ref 65–99)
Glucose-Capillary: 140 mg/dL — ABNORMAL HIGH (ref 65–99)

## 2016-09-25 LAB — INFLUENZA PANEL BY PCR (TYPE A & B)
INFLAPCR: NEGATIVE
Influenza B By PCR: NEGATIVE

## 2016-09-25 LAB — AMMONIA
AMMONIA: 33 umol/L (ref 9–35)
AMMONIA: 36 umol/L — AB (ref 9–35)

## 2016-09-25 LAB — I-STAT CG4 LACTIC ACID, ED
LACTIC ACID, VENOUS: 1.93 mmol/L — AB (ref 0.5–1.9)
LACTIC ACID, VENOUS: 2.56 mmol/L — AB (ref 0.5–1.9)

## 2016-09-25 LAB — CBG MONITORING, ED: GLUCOSE-CAPILLARY: 131 mg/dL — AB (ref 65–99)

## 2016-09-25 LAB — STREP PNEUMONIAE URINARY ANTIGEN: STREP PNEUMO URINARY ANTIGEN: NEGATIVE

## 2016-09-25 LAB — PROTIME-INR
INR: 1.12
Prothrombin Time: 14.5 seconds (ref 11.4–15.2)

## 2016-09-25 LAB — LIPASE, BLOOD: Lipase: 28 U/L (ref 11–51)

## 2016-09-25 LAB — MRSA PCR SCREENING: MRSA BY PCR: NEGATIVE

## 2016-09-25 LAB — ETHANOL

## 2016-09-25 LAB — OSMOLALITY: Osmolality: 298 mOsm/kg — ABNORMAL HIGH (ref 275–295)

## 2016-09-25 LAB — MAGNESIUM: Magnesium: 0.9 mg/dL — CL (ref 1.7–2.4)

## 2016-09-25 MED ORDER — AZITHROMYCIN 500 MG PO TABS: 500.0000 mg | ORAL_TABLET | ORAL | Status: DC

## 2016-09-25 MED ORDER — ENOXAPARIN SODIUM 40 MG/0.4ML ~~LOC~~ SOLN: 40.0000 mg | SUBCUTANEOUS | Status: DC

## 2016-09-25 MED ORDER — VITAMIN B-1 100 MG PO TABS
100.0000 mg | ORAL_TABLET | Freq: Every day | ORAL | Status: DC
  Administered 2016-09-25 – 2016-09-27 (×3): 100 mg via ORAL
  Filled 2016-09-25 (×4): qty 1

## 2016-09-25 MED ORDER — ONDANSETRON HCL 4 MG/2ML IJ SOLN
4.0000 mg | Freq: Four times a day (QID) | INTRAMUSCULAR | Status: DC | PRN
  Administered 2016-09-29 (×3): 4 mg via INTRAVENOUS
  Filled 2016-09-25 (×3): qty 2

## 2016-09-25 MED ORDER — MAGNESIUM SULFATE 4 GM/100ML IV SOLN
4.0000 g | Freq: Once | INTRAVENOUS | Status: AC
Start: 2016-09-25 — End: 2016-09-25
  Administered 2016-09-25: 4 g via INTRAVENOUS
  Filled 2016-09-25: qty 100

## 2016-09-25 MED ORDER — GABAPENTIN 100 MG PO CAPS: 100.0000 mg | ORAL_CAPSULE | Freq: Two times a day (BID) | ORAL | Status: DC

## 2016-09-25 MED ORDER — ASPIRIN EC 81 MG PO TBEC
81.0000 mg | DELAYED_RELEASE_TABLET | Freq: Every day | ORAL | Status: DC
  Administered 2016-09-25 – 2016-09-27 (×3): 81 mg via ORAL
  Filled 2016-09-25 (×4): qty 1

## 2016-09-25 MED ORDER — ACETAMINOPHEN 650 MG RE SUPP
650.0000 mg | Freq: Once | RECTAL | Status: AC
  Administered 2016-09-25: 650 mg via RECTAL
  Filled 2016-09-25: qty 1

## 2016-09-25 MED ORDER — LORAZEPAM 1 MG PO TABS: 1.0000 mg | ORAL_TABLET | Freq: Four times a day (QID) | ORAL | Status: DC | PRN

## 2016-09-25 MED ORDER — METRONIDAZOLE IN NACL 5-0.79 MG/ML-% IV SOLN
500.0000 mg | Freq: Three times a day (TID) | INTRAVENOUS | Status: DC
  Administered 2016-09-25 – 2016-09-26 (×3): 500 mg via INTRAVENOUS
  Filled 2016-09-25 (×3): qty 100

## 2016-09-25 MED ORDER — PANTOPRAZOLE SODIUM 40 MG PO TBEC
40.0000 mg | DELAYED_RELEASE_TABLET | Freq: Every day | ORAL | Status: DC
  Administered 2016-09-25: 40 mg via ORAL
  Filled 2016-09-25 (×4): qty 1

## 2016-09-25 MED ORDER — LEVOFLOXACIN IN D5W 500 MG/100ML IV SOLN
500.0000 mg | INTRAVENOUS | Status: DC
  Administered 2016-09-26: 500 mg via INTRAVENOUS
  Filled 2016-09-25: qty 100

## 2016-09-25 MED ORDER — DEXTROSE 5 % IV SOLN
500.0000 mg | Freq: Once | INTRAVENOUS | Status: AC
  Administered 2016-09-25: 500 mg via INTRAVENOUS
  Filled 2016-09-25: qty 500

## 2016-09-25 MED ORDER — THIAMINE HCL 100 MG/ML IJ SOLN: 100.0000 mg | Freq: Every day | INTRAMUSCULAR | Status: DC

## 2016-09-25 MED ORDER — MOMETASONE FURO-FORMOTEROL FUM 200-5 MCG/ACT IN AERO
2.0000 | INHALATION_SPRAY | Freq: Two times a day (BID) | RESPIRATORY_TRACT | Status: DC
  Administered 2016-09-26 – 2016-09-30 (×4): 2 via RESPIRATORY_TRACT
  Filled 2016-09-25 (×2): qty 8.8

## 2016-09-25 MED ORDER — SODIUM CHLORIDE 0.9 % IV SOLN
INTRAVENOUS | Status: DC
  Administered 2016-09-25: 04:00:00 via INTRAVENOUS

## 2016-09-25 MED ORDER — ADULT MULTIVITAMIN W/MINERALS CH
1.0000 | ORAL_TABLET | Freq: Every day | ORAL | Status: DC
  Administered 2016-09-25 – 2016-09-27 (×3): 1 via ORAL
  Filled 2016-09-25 (×4): qty 1

## 2016-09-25 MED ORDER — FOLIC ACID 1 MG PO TABS
1.0000 mg | ORAL_TABLET | Freq: Every day | ORAL | Status: DC
  Administered 2016-09-25 – 2016-09-27 (×3): 1 mg via ORAL
  Filled 2016-09-25 (×4): qty 1

## 2016-09-25 MED ORDER — DEXTROSE 5 % IV SOLN
1.0000 g | Freq: Once | INTRAVENOUS | Status: AC
  Administered 2016-09-25: 1 g via INTRAVENOUS
  Filled 2016-09-25: qty 10

## 2016-09-25 MED ORDER — GABAPENTIN 100 MG PO CAPS
100.0000 mg | ORAL_CAPSULE | Freq: Every day | ORAL | Status: DC
  Administered 2016-09-26 – 2016-09-29 (×4): 100 mg via ORAL
  Filled 2016-09-25 (×5): qty 1

## 2016-09-25 MED ORDER — METOPROLOL SUCCINATE ER 50 MG PO TB24
50.0000 mg | ORAL_TABLET | Freq: Every day | ORAL | Status: DC
  Administered 2016-09-25 – 2016-09-27 (×3): 50 mg via ORAL
  Filled 2016-09-25 (×5): qty 1

## 2016-09-25 MED ORDER — IOPAMIDOL (ISOVUE-300) INJECTION 61%
INTRAVENOUS | Status: AC
  Filled 2016-09-25: qty 100

## 2016-09-25 MED ORDER — ONDANSETRON HCL 4 MG PO TABS: 4.0000 mg | ORAL_TABLET | Freq: Four times a day (QID) | ORAL | Status: DC | PRN

## 2016-09-25 MED ORDER — LEVETIRACETAM 500 MG PO TABS
500.0000 mg | ORAL_TABLET | Freq: Two times a day (BID) | ORAL | Status: DC
  Administered 2016-09-25 – 2016-09-27 (×6): 500 mg via ORAL
  Filled 2016-09-25 (×8): qty 1

## 2016-09-25 MED ORDER — INSULIN ASPART 100 UNIT/ML ~~LOC~~ SOLN
0.0000 [IU] | Freq: Three times a day (TID) | SUBCUTANEOUS | Status: DC
  Administered 2016-09-25: 2 [IU] via SUBCUTANEOUS
  Administered 2016-09-25 – 2016-09-27 (×5): 1 [IU] via SUBCUTANEOUS
  Administered 2016-09-27: 2 [IU] via SUBCUTANEOUS
  Administered 2016-09-27: 1 [IU] via SUBCUTANEOUS
  Administered 2016-09-28: 2 [IU] via SUBCUTANEOUS
  Administered 2016-09-28: 1 [IU] via SUBCUTANEOUS
  Administered 2016-09-28: 3 [IU] via SUBCUTANEOUS
  Administered 2016-09-29 (×3): 2 [IU] via SUBCUTANEOUS
  Administered 2016-09-30 (×2): 1 [IU] via SUBCUTANEOUS
  Administered 2016-09-30: 2 [IU] via SUBCUTANEOUS

## 2016-09-25 MED ORDER — ACETAMINOPHEN 650 MG RE SUPP: 650.0000 mg | Freq: Four times a day (QID) | RECTAL | Status: DC | PRN

## 2016-09-25 MED ORDER — ACETAMINOPHEN 325 MG PO TABS
650.0000 mg | ORAL_TABLET | Freq: Four times a day (QID) | ORAL | Status: DC | PRN
  Administered 2016-09-26 – 2016-09-30 (×2): 650 mg via ORAL
  Filled 2016-09-25 (×3): qty 2

## 2016-09-25 MED ORDER — LORAZEPAM 2 MG/ML IJ SOLN: 1.0000 mg | Freq: Four times a day (QID) | INTRAMUSCULAR | Status: DC | PRN

## 2016-09-25 MED ORDER — DEXTROSE 5 % IV SOLN: 1.0000 g | INTRAVENOUS | Status: DC

## 2016-09-25 MED ORDER — SODIUM CHLORIDE 0.9 % IV BOLUS (SEPSIS)
1000.0000 mL | Freq: Once | INTRAVENOUS | Status: AC
  Administered 2016-09-25: 1000 mL via INTRAVENOUS

## 2016-09-25 MED ORDER — CITALOPRAM HYDROBROMIDE 20 MG PO TABS
20.0000 mg | ORAL_TABLET | Freq: Every day | ORAL | Status: DC
  Administered 2016-09-25 – 2016-09-27 (×3): 20 mg via ORAL
  Filled 2016-09-25 (×3): qty 1

## 2016-09-25 MED ORDER — ALBUTEROL SULFATE (2.5 MG/3ML) 0.083% IN NEBU
2.5000 mg | INHALATION_SOLUTION | RESPIRATORY_TRACT | Status: DC | PRN
  Administered 2016-09-29 – 2016-09-30 (×2): 2.5 mg via RESPIRATORY_TRACT
  Filled 2016-09-25 (×2): qty 3

## 2016-09-25 MED ORDER — POTASSIUM CHLORIDE IN NACL 20-0.9 MEQ/L-% IV SOLN
INTRAVENOUS | Status: DC
  Administered 2016-09-25 – 2016-09-27 (×6): via INTRAVENOUS
  Filled 2016-09-25 (×5): qty 1000

## 2016-09-25 NOTE — Progress Notes (Signed)
Pt seen and examined admitted this am by Dr.Danford 55/M with h/o seizure d/o, ETOH use, Prior CVA, L sided weakness admitted with Metabolic encephalopathy On exam has asterixis, but ammonia is 36, will repeat CT head with old CVA and atrophy Wife reports that she gives him 2drinks/day (only 1oz each) CXR with basilar atx, will repeat also check FLu PCR CTA abd with hepatic steatosis, and ?  Mild colitis, change Abx to levaqin + flagyl to cover CAP and abd source  Zannie CovePreetha Jazmine Heckman, MD

## 2016-09-25 NOTE — Progress Notes (Signed)
Pt arrived to unit at 0515, VS completed. Pt transported to CT as soon as he arrived to unit.  Pt arousable, but goes back to sleep right away, very lethargic and unable to speak due to weakness.  Cardiac monitor placed on pt. Call light within reach.

## 2016-09-25 NOTE — H&P (Signed)
History and Physical  Patient Name: Michael Patel     WUJ:[id number]    DOB: 1961-04-28    DOA: 09/11/2016 PCP: Ignatius Speckinghruv B Vyas, MD   Patient coming from: Home via EMS  Chief Complaint: Confusion  HPI: Michael Patel is a 56 y.o. male with a past medical history significant for seizures on Keppra and alcohol use who presents with 3 days confusion.  History is collected from the patient's wife, as the patient is confused and somnolent.  Per wife he was his normal self (conversational, independent, but mostly nonambulatory) until about 3 days ago when she noticed he was gradually becoming weaker, less responsive to questions, and more listless.  She notes there was one large vomiting episode around the time she started to notice his change in status, but he has had no fever, change in chronic cough, sputum production, hemoptysis, focal weakness, speech change, focal complaint.  She called 9-1-1 twice this week already, but he refused transport until tonight he was too confused to refuse, EMS found his SpO2 upper 80s, and brought him in.  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 Seizures (HCC)    01/26/15  . Type 2 diabetes mellitus (HCC)    controlled    Past Surgical History:  Procedure Laterality Date  . left humorous fracture    . left total hip replacement      Social History: Patient lives with his wife.  The patient walks very little at baseline (he had some large illness several years ago, doesn't walk well since, mostly left leg weakness which wife attributes to his hip surgeries; his Neuro notes from 2 yaers ago describe his gait as "hemiparetic", and his CT shows a right parietal stroke). He is an active smoker.  He drinks "2 small drinks" per day per wife. He lives in [place].  He was a Chartered certified accountantmachinist.  Allergies  Allergen Reactions  . Morphine And Related Other (See Comments)    unknown  . Oxycodone Itching and Rash    Family history: family history includes Brain cancer in his father; Breast cancer in his mother; Cancer in his sister; Diabetes Mellitus II in his maternal grandmother and sister; Diabetes type II in his mother; Lung cancer in his mother; Other in his father.  Prior to Admission medications   Medication Sig Start Date End Date Taking? Authorizing Provider  aspirin EC 81 MG tablet Take 81 mg by mouth daily.   Yes Historical Provider, MD  citalopram (CELEXA) 20 MG tablet Take 20 mg by mouth at bedtime.   Yes Historical Provider,  MD  Dexlansoprazole (DEXILANT) 30 MG capsule Take 30 mg by mouth daily.   Yes Historical Provider, MD  gabapentin (NEURONTIN) 100 MG capsule Take 100 mg by mouth 2 (two) times daily.   Yes Historical Provider, MD  ibuprofen (ADVIL,MOTRIN) 200 MG tablet Take 400 mg by mouth 2 (two) times daily.   Yes Historical Provider, MD  levETIRAcetam (KEPPRA) 500 MG tablet Take 1 tablet (500 mg total) by mouth 2 (two) times daily. 05/10/15  Yes [name], MD  LORazepam (ATIVAN) 0.5 MG tablet Take 0.5 mg by mouth 2 (two) times daily as needed for anxiety.   Yes Historical  Provider, MD  metoprolol succinate (TOPROL-XL) 50 MG 24 hr tablet Take 50 mg by mouth daily. Take with or immediately following a meal.   Yes Historical Provider, MD  mometasone-formoterol (DULERA) 200-5 MCG/ACT AERO Inhale 2 puffs into the lungs 2 (two) times daily.   Yes Historical Provider, MD       Physical Exam: BP 161/89   Pulse 89   Temp 100.2 F (37.9 C) (Rectal)   Resp 26   Ht 6\' 3"  (1.905 m)   Wt 89.4 kg (197 lb)   SpO2 100%   BMI 24.62 kg/m  General appearance: Adult male, appears older than stated age.  Somnolent, does not follow commands consistently.  Opens eyes, can only groan in response to questions. Eyes: Anicteric, conjunctiva pink, lids and lashes normal. PERRL.    ENT: No nasal deformity, discharge, epistaxis.  Hearing hard to test. OP dry without lesions.   Neck: No neck masses.  Trachea midline.  No thyromegaly/tenderness. Lymph: No cervical or supraclavicular lymphadenopathy. Skin: Warm and dry.  No jaundice.  No suspicious rashes or lesions. Cardiac: Tachycardic, nl S1-S2, no murmurs appreciated.  Capillary refill is brisk.  JVP normal.  No LE edema.  Radial and DP pulses 2+ and symmetric. Respiratory: Tachypneic, labored.  Coarse breath sounds bilaterally. Abdomen: Abdomen with voluntary guarding.  ?TTP. No distension, unable to appreciate hepatosplenomegaly given guarding.    MSK: No deformities or effusions.  No cyanosis or clubbing.  Right finger partial amputation. Neuro: Does not consistently follow commands. Only groans to questions.  PERRL.  No gaze preference.  Moves both upper extremities equally. Psych: Unable to assess.     Labs on Admission:  I have personally reviewed following labs and imaging studies: CBC:  Recent Labs Lab 09/25/16 0013  WBC 5.8  NEUTROABS 4.9  HGB 12.3*  HCT 36.6*  MCV 102.8*  PLT 125*   Basic Metabolic Panel:  Recent Labs Lab 09/25/16 0013  NA 138  K 3.2*  CL 97*  CO2 25  GLUCOSE 148*  BUN 42*    CREATININE 1.06  CALCIUM 9.3   GFR: Estimated Creatinine Clearance: 91.9 mL/min (by C-G formula based on SCr of 1.06 mg/dL).  Liver Function Tests:  Recent Labs Lab 09/25/16 0013  AST 29  ALT 15*  ALKPHOS 50  BILITOT 2.3*  PROT 7.9  ALBUMIN 3.0*    Recent Labs Lab 09/25/16 0013  LIPASE 28    Recent Labs Lab 09/25/16 0015  AMMONIA 36*   Coagulation Profile: No results for input(s): INR, PROTIME in the last 168 hours. Cardiac Enzymes: No results for input(s): CKTOTAL, CKMB, CKMBINDEX, TROPONINI in the last 168 hours. BNP (last 3 results) No results for input(s): PROBNP in the last 8760 hours. HbA1C: No results for input(s): HGBA1C in the last 72 hours. CBG:  Recent Labs Lab 09/25/16 0103  GLUCAP  131*   Lipid Profile: No results for input(s): CHOL, HDL, LDLCALC, TRIG, CHOLHDL, LDLDIRECT in the last 72 hours. Thyroid Function Tests: No results for input(s): TSH, T4TOTAL, FREET4, T3FREE, THYROIDAB in the last 72 hours. Anemia Panel: No results for input(s): VITAMINB12, FOLATE, FERRITIN, TIBC, IRON, RETICCTPCT in the last 72 hours. Sepsis Labs: Lactic acid 2.56 Invalid input(s): PROCALCITONIN, LACTICIDVEN No results found for this or any previous visit (from the past 240 hour(s)).       Radiological Exams on Admission: Personally reviewed CXR shows possible LL opacity; CT head report reviewed: Ct Head Wo Contrast  Result Date: 09/25/2016 CLINICAL DATA:  Altered mental status.  Unresponsive. EXAM: CT HEAD WITHOUT CONTRAST TECHNIQUE: Contiguous axial images were obtained from the base of the skull through the vertex without intravenous contrast. COMPARISON:  Head CT 01/26/2015 FINDINGS: Brain: No evidence of acute infarction, hemorrhage, hydrocephalus, extra-axial collection or mass lesion/mass effect. Generalized atrophy, mild progression from prior and advanced for age. Remote right parietal infarct. Progressive chronic small vessel ischemia. Vascular:  Atherosclerosis of skullbase vasculature without hyperdense vessel or abnormal calcification. Skull: Normal. Negative for fracture or focal lesion. Sinuses/Orbits: Near complete opacification of the left maxillary sinus, with central high density material. Mild adjacent bony sclerosis. Scattered mucosal thickening of left ethmoid air cells. Mastoid air cells are well-aerated. Other: None. IMPRESSION: 1.  No acute intracranial abnormality. 2. Progressive atrophy and chronic small vessel ischemia since 2016. Remote right parietal infarct. 3. Left maxillary sinus disease. Surrounding bony scleroses suggests this is chronic, however was not present on prior exam. Consider atypical/fungal infection. Electronically Signed   By: Rubye Oaks M.D.   On: 09/25/2016 02:08   Dg Chest Portable 1 View  Result Date: 09/25/2016 CLINICAL DATA:  Altered mental status cough EXAM: PORTABLE CHEST 1 VIEW COMPARISON:  10/27/2014 FINDINGS: Mild bibasilar atelectasis, cannot rule out mild infiltrate at the left base. No effusion. Low lung volumes. Stable cardiomediastinal silhouette with atherosclerosis. No pneumothorax IMPRESSION: 1. Low lung volumes with mild bibasilar atelectasis. Cannot exclude small infiltrate at the left base Electronically Signed   By: Jasmine Pang M.D.   On: 09/25/2016 00:58    EKG: Independently reviewed. ECG rate 96, QTc 431, diffuse TWI, no ST changes.    Assessment/Plan  1. Sepsis suspected pneumonia:  Suspected source pneumonia. Organism unknown.   Patient meets criteria given tachycardia, tachypnea, and evidence of organ dysfunction.  Lactate 2.56 mmol/L and repeat ordered within 6 hours.  This patient is at at high risk of poor outcomes with a qSOFA score of 2.  Antibiotics delivered in the ED.    -Sepsis bundle utilized:  -Blood cultures drawn  -Antibiotics: ceftriaxone and azithromycin  -Repeat renal function and complete blood count in AM  -Check flu panel and RVP    2.  Elevated bilirubin with thrombocytopenia:  Has low albumin, platelets, history of alcohol use.  ?cirrhosis.  Abdomen exam abnormal. -CT abdomen and pelvis with contrast to eval for intra-abd infection, cholecystitis, also eval for radiographic evidence cirrhosis -Check INR -CIWA protocol with lorazepam -Thiamine and MVI  3. Encephalopathy:  He has a global encephalopathy consistent with either infection, hypercarbia, hepatic enceph. -Check ABG -Treat infection  4. Elevated anion gap:  Mild uremia, mild lactic acidosis.  ?cirrhosis. -Check osmolarity -Fluids and trend lactic acid and BMP  5. Seizure disorder:  -Continue Keppra, gabapentin  6. Hypertension:  -Continue metoprolol and aspirin  7. COPD:  No wheezing on exam.  Mild hypoxia. -Continue Dulera -Albuterol PRN  8. Other medications:  -Continue PPI -Continue Celexa      DVT prophylaxis: Lovenox  Code Status: FULL  Family Communication: Wife at bedside, overnight plan discussed, CODE STATUS confirmed  Disposition Plan: Anticipate IV fluids, empiric antibiotics, follow cultures, review CT abdomen Consults called: None Admission status: INPATIENT, med surg    Medical decision making: Patient seen at 2:45 AM on 09/25/2016.  The patient was discussed with Dr. Elesa Massed.  What exists of the patient's chart was reviewed in depth and summarized above.  Clinical condition: stable.        Alberteen Sam [facility] Pager [phone]      At the time of admission, it appears that the appropriate admission status for this patient is INPATIENT. This is judged to be reasonable and necessary in order to provide the required intensity of service to ensure the patient's safety given the presenting symptoms, physical exam findings, and initial radiographic and laboratory data in the context of their chronic comorbidities.  Together, these circumstances are felt to place him at high risk for further clinical  deterioration threatening life, limb, or organ.   Patient requires inpatient status due to high intensity of service, high risk for further deterioration and high frequency of surveillance required because of this acute illness that poses a threat to life, limb or bodily function.  I certify that at the point of admission it is my clinical judgment that the patient will require inpatient hospital care spanning beyond 2 midnights from the point of admission and that early discharge would result in unnecessary risk of decompensation and readmission or threat to life, limb or bodily function.

## 2016-09-25 NOTE — Progress Notes (Signed)
Report given to nurse on 4E Carole,RN

## 2016-09-25 NOTE — Progress Notes (Signed)
CRITICAL VALUE ALERT  Critical value received: Magnesium 0.9  Date of notification:  09/25/16 Time of notification:  0619  Critical value read back:Yes.    Nurse who received alert:  Christean GriefIvana Jaeger Trueheart  MD notified (1st page):  Craige CottaKirby, NP  Time of first page:  [phone]  MD notified (2nd page):  Time of second page:  Responding MD:  Craige CottaKirby, NP  Time MD responded:  [phone]

## 2016-09-25 NOTE — Progress Notes (Signed)
Pt back from ct scan, responses with grimacing to pain stimuli only, VS stable. According to MD notes this is pt condition since he got to ED. Craige CottaKirby, NP notified, will continue to monitor pt.

## 2016-09-26 ENCOUNTER — Inpatient Hospital Stay (HOSPITAL_COMMUNITY): Payer: Self-pay

## 2016-09-26 LAB — COMPREHENSIVE METABOLIC PANEL
ALK PHOS: 53 U/L (ref 38–126)
ALT: 20 U/L (ref 17–63)
ANION GAP: 9 (ref 5–15)
AST: 58 U/L — ABNORMAL HIGH (ref 15–41)
Albumin: 2.4 g/dL — ABNORMAL LOW (ref 3.5–5.0)
BILIRUBIN TOTAL: 1.8 mg/dL — AB (ref 0.3–1.2)
BUN: 23 mg/dL — ABNORMAL HIGH (ref 6–20)
CALCIUM: 8.4 mg/dL — AB (ref 8.9–10.3)
CO2: 28 mmol/L (ref 22–32)
Chloride: 107 mmol/L (ref 101–111)
Creatinine, Ser: 0.76 mg/dL (ref 0.61–1.24)
GFR calc non Af Amer: >60 mL/min (ref >60)
Glucose, Bld: 118 mg/dL — ABNORMAL HIGH (ref 65–99)
Potassium: 3.4 mmol/L — ABNORMAL LOW (ref 3.5–5.1)
Sodium: 144 mmol/L (ref 135–145)
TOTAL PROTEIN: 6.3 g/dL — AB (ref 6.5–8.1)

## 2016-09-26 LAB — GLUCOSE, CAPILLARY
GLUCOSE-CAPILLARY: 123 mg/dL — AB (ref 65–99)
GLUCOSE-CAPILLARY: 126 mg/dL — AB (ref 65–99)
GLUCOSE-CAPILLARY: 142 mg/dL — AB (ref 65–99)
Glucose-Capillary: 140 mg/dL — ABNORMAL HIGH (ref 65–99)

## 2016-09-26 LAB — CBC
HEMATOCRIT: 28.7 % — AB (ref 39.0–52.0)
HEMOGLOBIN: 9.6 g/dL — AB (ref 13.0–17.0)
MCH: 34.7 pg — AB (ref 26.0–34.0)
MCHC: 33.4 g/dL (ref 30.0–36.0)
MCV: 103.6 fL — ABNORMAL HIGH (ref 78.0–100.0)
Platelets: 99 10*3/uL — ABNORMAL LOW (ref 150–400)
RBC: 2.77 MIL/uL — ABNORMAL LOW (ref 4.22–5.81)
RDW: 15.6 % — AB (ref 11.5–15.5)
WBC: 3.5 10*3/uL — ABNORMAL LOW (ref 4.0–10.5)

## 2016-09-26 MED ORDER — LEVOFLOXACIN 500 MG PO TABS
500.0000 mg | ORAL_TABLET | Freq: Every day | ORAL | Status: DC
  Administered 2016-09-27 – 2016-09-28 (×2): 500 mg via ORAL
  Filled 2016-09-26 (×2): qty 1

## 2016-09-26 MED ORDER — RESOURCE THICKENUP CLEAR PO POWD
ORAL | Status: DC | PRN
Start: 2016-09-26 — End: 2016-09-30
  Administered 2016-09-26: 18:00:00 via ORAL
  Filled 2016-09-26 (×2): qty 125

## 2016-09-26 MED ORDER — METRONIDAZOLE 500 MG PO TABS
500.0000 mg | ORAL_TABLET | Freq: Three times a day (TID) | ORAL | Status: DC
  Administered 2016-09-26 – 2016-09-28 (×6): 500 mg via ORAL
  Filled 2016-09-26 (×6): qty 1

## 2016-09-26 NOTE — Evaluation (Signed)
Clinical/Bedside Swallow Evaluation Patient Details  Name: DUWARD ALLBRITTON MRN: [id number] Date of Birth: [birth year]  Today's Date: 09/26/2016 Time: SLP Start Time (ACUTE ONLY): 0901 SLP Stop Time (ACUTE ONLY): 4098 SLP Time Calculation (min) (ACUTE ONLY): 21 min  Past Medical History:  Past Medical History:  Diagnosis Date  . 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 Seizures (HCC)    01/26/15  . Type 2 diabetes mellitus (HCC)    controlled   Past Surgical History:  Past Surgical History:  Procedure Laterality Date  . left humorous fracture    . left total hip replacement     HPI:  Ptis a 56 y.o.malewith PMH of DM no longer on medication, prior CVA, alcohol use, and seizures on Keppra, [facility] generalized weakness onset three days ago, vomiting/diarrhea, confusion, difficulty speaking. He does not ambulate and has not walked in years. CXR 1/17 portable low lung volumes with mild bibasilar atelectasis. Could not exclude small infiltrate at the left base and repeat CXR 1/17 no active cardiopulmonary disease. CT head scan showed no acute intracranial abnormality; progressive atrophy and chronic small vessel ischemia since 2016, remote right parietal infarct; left maxillary sinus disease.   Assessment / Plan / Recommendation Clinical Impression  Mr. Banka appeared to be delayed in his processing and required extra effort to attend to directions and POs. Pt was administered thin liquids via cup with signs/symptoms and attempted nectar thick with similar results such as multiple swallows, throat clearing, and coughing. The pt was educated re: the risks of aspiration and was informed that an instrumental test (MBSS) would be completed this morning in order to confirm suspicions of penetration/aspiration.    Aspiration Risk  Moderate  aspiration risk    Diet Recommendation NPO (MBS scheduled)   Liquid Administration via: Cup Medication Administration: Crushed with puree Supervision: Full supervision/cueing for compensatory strategies;Staff to assist with self feeding Compensations: Slow rate;Small sips/bites;Multiple dry swallows after each bite/sip Postural Changes: Seated upright at 90 degrees    Other  Recommendations Oral Care Recommendations: Oral care QID;Oral care before and after PO;Oral care prior to ice chip/H20;Staff/trained caregiver to provide oral care   Follow up Recommendations        Frequency and Duration            Prognosis Prognosis for Safe Diet Advancement: Good      Swallow Study   General HPI: Ptis a 56 y.o.malewith PMH of DM no longer on medication, prior CVA, alcohol use, and seizures on Keppra, who [facility] of generalized weakness onset three days ago, vomiting/diarrhea, confusion, difficulty speaking. He does not ambulate and has not walked in years. CXR 1/17 portable low lung volumes with mild bibasilar atelectasis. Could not exclude small infiltrate at the left base and repeat CXR 1/17 no active cardiopulmonary disease. CT head scan showed no acute intracranial abnormality; progressive atrophy and chronic small vessel ischemia since 2016, remote right parietal infarct; left maxillary sinus disease. Type of Study: Bedside Swallow Evaluation Previous Swallow Assessment: none Diet Prior to this Study: Regular;Thin liquids Temperature Spikes Noted: No Respiratory Status: Room air;Nasal cannula History of Recent Intubation: No Behavior/Cognition: Requires cueing;Cooperative;Alert Oral Cavity Assessment: Dry;Excessive secretions Oral Care Completed by SLP: Yes Oral Cavity - Dentition: Adequate natural dentition Vision: Functional for self-feeding Self-Feeding Abilities: Needs assist;Needs set  up Patient Positioning: Upright in bed Baseline Vocal  Quality: Low vocal intensity Volitional Cough: Weak Volitional Swallow: Able to elicit    Oral/Motor/Sensory Function Overall Oral Motor/Sensory Function: Within functional limits   Ice Chips Ice chips: Not tested   Thin Liquid Thin Liquid: Impaired Presentation: Cup Oral Phase Functional Implications: Oral holding Pharyngeal  Phase Impairments: Suspected delayed Swallow;Multiple swallows;Throat Clearing - Immediate;Cough - Immediate    Nectar Thick Nectar Thick Liquid: Impaired Presentation: Cup Oral phase functional implications: Oral holding Pharyngeal Phase Impairments: Suspected delayed Swallow;Multiple swallows;Throat Clearing - Immediate;Cough - Immediate   Honey Thick Honey Thick Liquid: Not tested   Puree Puree: Not tested   Solid   GO   Solid: Not tested        Macarthur CritchleyMeredith Kendry Pfarr, Student SLP 09/26/2016,11:08 AM

## 2016-09-26 NOTE — Progress Notes (Signed)
PROGRESS NOTE    Michael Patel  ZOX:[id number] DOB: [birth year] DOA: 2017/05/22 PCP: Michael Speckinghruv B Vyas, MD  Brief Narrative: 55/M with h/o seizure d/o, ETOH use, Prior CVA, L sided weakness admitted with Metabolic encephalopathy, CT abd with Mild colitis, some diarrhea inpatient Finally waking up  Assessment & Plan: 1. Sepsis  -due to ? Colitis, low grade fevers on admission with metabolic encephalopathy, CT with colon wall thickening. -improving, cut down IVF -continue levaquin/flagyl, improving -await GI pathogen panel -abd benign, diet advanced -UA/CXR/FLu negative              2. Metabolic encephalopathy -due to above, improving -ammonia normal, CT with old CVA and diffuse atrophy -SLp eval pending  3. Alcoholic liver disease -Has low albumin, platelets, history of alcohol use. Ct with hepatic steatosis -continue thiamine, now drinks small amounts daily only, no withdrawal noted -monitor CIWA,   4. Seizure disorder:  -Continue Keppra, gabapentin  5. Hypertension:  -Continue metoprolol and aspirin  6. COPD:  -stable, Continue Dulera/Albuterol PRN   7. Depression -Continue Celexa  DVT prophylaxis: Lovenox  Code Status: FULL  Family Communication: no family at bedside, called and d/w wife 1/17 Disposition Plan: Home in ?2days   Consultants:      Antimicrobials: Levaquin/FLagyl 1/17>   Subjective: More awake, some more diarrhea yesterday  Objective: Vitals:   09/25/16 2338 09/26/16 0346 09/26/16 0712 09/26/16 0750  BP: (!) 142/85 (!) 155/91 (!) 141/77   Pulse: 81 94 98 98  Resp: (!) 22 (!) 24 18 (!) 24  Temp: (!) 96.9 F (36.1 C) 97.6 F (36.4 C)  97.8 F (36.6 C)  TempSrc: Axillary Oral  Oral  SpO2: 99% 98% 96% 97%  Weight:      Height:        Intake/Output Summary (Last 24 hours) at 09/26/16 1111 Last data filed at 09/26/16 0750  Gross per 24 hour  Intake          3461.67 ml  Output              725 ml  Net          2736.67 ml   Filed  Weights   09/25/16 0033 09/25/16 0700 09/25/16 1602  Weight: 89.4 kg (197 lb) 79.9 kg (176 lb 1.6 oz) 79.3 kg (174 lb 13.2 oz)    Examination:  General exam: Alert, awake today, able to converse, oriented to self and place  Respiratory system: Clear to auscultation. Respiratory effort normal. Cardiovascular system: S1 & S2 heard, RRR Gastrointestinal system: Abdomen is nondistended, soft and nontender. Normal bowel sounds heard. Central nervous system: more lucid today No focal neurological deficits. Extremities: Symmetric 5 x 5 power. Skin: No rashes, lesions or ulcers Psychiatry: flat affect    Data Reviewed: I have personally reviewed following labs and imaging studies  CBC:  Recent Labs Lab 09/25/16 0013 09/26/16 0247  WBC 5.8 3.5*  NEUTROABS 4.9  --   HGB 12.3* 9.6*  HCT 36.6* 28.7*  MCV 102.8* 103.6*  PLT 125* 99*   Basic Metabolic Panel:  Recent Labs Lab 09/25/16 0013 09/25/16 0529 09/26/16 0247  NA 138  --  144  K 3.2*  --  3.4*  CL 97*  --  107  CO2 25  --  28  GLUCOSE 148*  --  118*  BUN 42*  --  23*  CREATININE 1.06  --  0.76  CALCIUM 9.3  --  8.4*  MG  --  0.9*  --  GFR: Estimated Creatinine Clearance: 114.3 mL/min (by C-G formula based on SCr of 0.76 mg/dL). Liver Function Tests:  Recent Labs Lab 09/25/16 0013 09/26/16 0247  AST 29 58*  ALT 15* 20  ALKPHOS 50 53  BILITOT 2.3* 1.8*  PROT 7.9 6.3*  ALBUMIN 3.0* 2.4*    Recent Labs Lab 09/25/16 0013  LIPASE 28    Recent Labs Lab 09/25/16 0015 09/25/16 1145  AMMONIA 36* 33   Coagulation Profile:  Recent Labs Lab 09/25/16 0529  INR 1.12   Cardiac Enzymes: No results for input(s): CKTOTAL, CKMB, CKMBINDEX, TROPONINI in the last 168 hours. BNP (last 3 results) No results for input(s): PROBNP in the last 8760 hours. HbA1C: No results for input(s): HGBA1C in the last 72 hours. CBG:  Recent Labs Lab 09/25/16 0103 09/25/16 1410 09/25/16 1655 09/25/16 2126  09/26/16 0842  GLUCAP 131* 146* 152* 140* 123*   Lipid Profile: No results for input(s): CHOL, HDL, LDLCALC, TRIG, CHOLHDL, LDLDIRECT in the last 72 hours. Thyroid Function Tests: No results for input(s): TSH, T4TOTAL, FREET4, T3FREE, THYROIDAB in the last 72 hours. Anemia Panel: No results for input(s): VITAMINB12, FOLATE, FERRITIN, TIBC, IRON, RETICCTPCT in the last 72 hours. Urine analysis:    Component Value Date/Time   COLORURINE AMBER (A) 09/25/2016 0058   APPEARANCEUR HAZY (A) 09/25/2016 0058   LABSPEC 1.027 09/25/2016 0058   PHURINE 5.0 09/25/2016 0058   GLUCOSEU NEGATIVE 09/25/2016 0058   HGBUR NEGATIVE 09/25/2016 0058   BILIRUBINUR SMALL (A) 09/25/2016 0058   KETONESUR 5 (A) 09/25/2016 0058   PROTEINUR 30 (A) 09/25/2016 0058   NITRITE NEGATIVE 09/25/2016 0058   LEUKOCYTESUR NEGATIVE 09/25/2016 0058   Sepsis Labs: @LABRCNTIP (procalcitonin:4,lacticidven:4)  ) Recent Results (from the past 240 hour(s))  Respiratory Panel by PCR     Status: None   Collection Time: 09/25/16  3:41 PM  Result Value Ref Range Status   Adenovirus NOT DETECTED NOT DETECTED Final   Coronavirus 229E NOT DETECTED NOT DETECTED Final   Coronavirus HKU1 NOT DETECTED NOT DETECTED Final   Coronavirus NL63 NOT DETECTED NOT DETECTED Final   Coronavirus OC43 NOT DETECTED NOT DETECTED Final   Metapneumovirus NOT DETECTED NOT DETECTED Final   Rhinovirus / Enterovirus NOT DETECTED NOT DETECTED Final   Influenza A NOT DETECTED NOT DETECTED Final   Influenza B NOT DETECTED NOT DETECTED Final   Parainfluenza Virus 1 NOT DETECTED NOT DETECTED Final   Parainfluenza Virus 2 NOT DETECTED NOT DETECTED Final   Parainfluenza Virus 3 NOT DETECTED NOT DETECTED Final   Parainfluenza Virus 4 NOT DETECTED NOT DETECTED Final   Respiratory Syncytial Virus NOT DETECTED NOT DETECTED Final   Bordetella pertussis NOT DETECTED NOT DETECTED Final   Chlamydophila pneumoniae NOT DETECTED NOT DETECTED Final   Mycoplasma  pneumoniae NOT DETECTED NOT DETECTED Final  MRSA PCR Screening     Status: None   Collection Time: 09/25/16  4:00 PM  Result Value Ref Range Status   MRSA by PCR NEGATIVE NEGATIVE Final    Comment:        The GeneXpert MRSA Assay (FDA approved for NASAL specimens only), is one component of a comprehensive MRSA colonization surveillance program. It is not intended to diagnose MRSA infection nor to guide or monitor treatment for MRSA infections.          Radiology Studies: Dg Chest 2 View  Result Date: 09/25/2016 CLINICAL DATA:  Dyspnea, diabetes, seizures EXAM: CHEST  2 VIEW COMPARISON:  09/25/2016 FINDINGS: The heart size and  mediastinal contours are within normal limits. Both lungs are clear. The visualized skeletal structures are unremarkable. IMPRESSION: No active cardiopulmonary disease. Electronically Signed   By: Elige Ko   On: 09/25/2016 11:46   Ct Head Wo Contrast  Result Date: 09/25/2016 CLINICAL DATA:  Altered mental status.  Unresponsive. EXAM: CT HEAD WITHOUT CONTRAST TECHNIQUE: Contiguous axial images were obtained from the base of the skull through the vertex without intravenous contrast. COMPARISON:  Head CT 01/26/2015 FINDINGS: Brain: No evidence of acute infarction, hemorrhage, hydrocephalus, extra-axial collection or mass lesion/mass effect. Generalized atrophy, mild progression from prior and advanced for age. Remote right parietal infarct. Progressive chronic small vessel ischemia. Vascular: Atherosclerosis of skullbase vasculature without hyperdense vessel or abnormal calcification. Skull: Normal. Negative for fracture or focal lesion. Sinuses/Orbits: Near complete opacification of the left maxillary sinus, with central high density material. Mild adjacent bony sclerosis. Scattered mucosal thickening of left ethmoid air cells. Mastoid air cells are well-aerated. Other: None. IMPRESSION: 1.  No acute intracranial abnormality. 2. Progressive atrophy and chronic  small vessel ischemia since 2016. Remote right parietal infarct. 3. Left maxillary sinus disease. Surrounding bony scleroses suggests this is chronic, however was not present on prior exam. Consider atypical/fungal infection. Electronically Signed   By: Rubye Oaks M.D.   On: 09/25/2016 02:08   Ct Abdomen Pelvis W Contrast  Result Date: 09/25/2016 CLINICAL DATA:  Elevated lactate level.  Altered mental status. EXAM: CT ABDOMEN AND PELVIS WITH CONTRAST TECHNIQUE: Multidetector CT imaging of the abdomen and pelvis was performed using the standard protocol following bolus administration of intravenous contrast. CONTRAST:  100 cc Isovue-300 IV COMPARISON:  None. FINDINGS: Lower chest: No focal consolidation or pleural fluid. Coronary artery calcifications. Hepatobiliary: Decreased hepatic density consistent with steatosis. No focal lesion. Gallbladder physiologically distended, no calcified stone. No biliary dilatation. Pancreas: No ductal dilatation or inflammation. Spleen: Normal in size without focal abnormality. Adrenals/Urinary Tract: No adrenal nodule. Homogeneous renal enhancement without hydronephrosis. No perinephric edema. Symmetric excretion on delayed phase imaging. Mild bladder wall thickening about the dome. Portions of the bladder obscured by streak artifact from left hip prosthesis. Stomach/Bowel: The stomach is decompressed. No small bowel dilatation or inflammation. Normal appendix. Liquid stool in the ascending and transverse colon. Mild colonic wall thickening involving the splenic flexure, descending, and sigmoid colon. No pneumatosis or perforation. Vascular/Lymphatic: No atherosclerosis of the abdominal aorta and its branches. No aneurysm. No portal venous or mesenteric gas. No adenopathy. Reproductive: Prostate is unremarkable. Other: Minimal mesenteric edema mostly in the lower pelvis. No ascites or free air. No intra-abdominal abscess. Musculoskeletal: Left hip arthroplasty. There are  no acute or suspicious osseous abnormalities. IMPRESSION: 1. Colonic wall thickening from the splenic flexure through the sigmoid colon, consistent with mild colitis. Liquid stool proximally. No obstruction or perforation. 2. Hepatic steatosis. 3. Intra-abdominal atherosclerosis. Coronary artery calcifications are seen. Electronically Signed   By: Rubye Oaks M.D.   On: 09/25/2016 06:14   Dg Chest Portable 1 View  Result Date: 09/25/2016 CLINICAL DATA:  Altered mental status cough EXAM: PORTABLE CHEST 1 VIEW COMPARISON:  10/27/2014 FINDINGS: Mild bibasilar atelectasis, cannot rule out mild infiltrate at the left base. No effusion. Low lung volumes. Stable cardiomediastinal silhouette with atherosclerosis. No pneumothorax IMPRESSION: 1. Low lung volumes with mild bibasilar atelectasis. Cannot exclude small infiltrate at the left base Electronically Signed   By: Jasmine Pang M.D.   On: 09/25/2016 00:58        Scheduled Meds: . aspirin EC  81  mg Oral Daily  . citalopram  20 mg Oral QHS  . folic acid  1 mg Oral Daily  . gabapentin  100 mg Oral QHS  . insulin aspart  0-9 Units Subcutaneous TID WC  . levETIRAcetam  500 mg Oral BID  . [START ON 09/27/2016] levofloxacin  500 mg Oral Daily  . metoprolol succinate  50 mg Oral Daily  . metroNIDAZOLE  500 mg Oral Q8H  . mometasone-formoterol  2 puff Inhalation BID  . multivitamin with minerals  1 tablet Oral Daily  . pantoprazole  40 mg Oral Daily  . thiamine  100 mg Oral Daily   Continuous Infusions: . 0.9 % NaCl with KCl 20 mEq / L 50 mL/hr at 09/26/16 0941     LOS: 1 day    Time spent:    Zannie Cove, MD [facility] Pager [phone]  If 7PM-7AM, please contact night-coverage [email or web address] Password [email or web address] 09/26/2016, 11:11 AM

## 2016-09-26 NOTE — Progress Notes (Signed)
Modified Barium Swallow Progress Note  Patient Details  Name: Michael Patel MRN: [id number] Date of Birth: Nov 10, 1960  Today's Date: 09/26/2016  Modified Barium Swallow completed.  Full report located under Chart Review in the Imaging Section.  Brief recommendations include the following:  Clinical Impression  Pt presents with mild oral dysphagia and mild-moderate pharyngeal dysphagia; oral dysphagia characterized by prolonged mastication and oral holding of bolus. Pharyngeal dysphagia characterized by penetration before the swallow, delayed swallow intiation to the valleculae and pyriform sinuses due to decreased pharyngeal sensation with thin and nectar liquids via cup. Pt with mostly silent penetration of thin liquids via straw before the swallow without ejection and nectar cup sip before the swallow. SLP provided verbal cues for volitional coughs/throat clears which were weak and ineffective. Compensatory strategies with head movements not attempted due to pt's decreased cognitive ability. Recommend honey thick liquids with Dys 1 (puree) diet, crush meds, no straws, full supervision and assist with meals.    Swallow Evaluation Recommendations       SLP Diet Recommendations: Dysphagia 1 (Puree) solids;Honey thick liquids   Liquid Administration via: Cup;No straw   Medication Administration: Crushed with puree   Supervision: Full supervision/cueing for compensatory strategies;Full assist for feeding   Compensations: Slow rate;Small sips/bites;Clear throat intermittently;Clear throat after each swallow;Multiple dry swallows after each bite/sip   Postural Changes: Seated upright at 90 degrees   Oral Care Recommendations: Oral care BID   Other Recommendations: Order thickener from pharmacy    Royce MacadamiaLitaker, Moyses Pavey Willis 09/26/2016,12:35 PM   Breck CoonsLisa Willis Lonell FaceLitaker M.Ed [employer] Pager [phone]

## 2016-09-26 NOTE — Care Management Note (Signed)
Case Management Note  Patient Details  Name: Abbott PaoDillard M Arrants MRN: [id number] Date of Birth: 04-Aug-1961  Subjective/Objective:    Presents with sepsis, pna, ams, lethargic, he is w/chair bound,  Patient states he does not have a pcp, he will like to go [facility], NCM asked if he would have transportation to get there to an apt, he said yes, his wife could bring him.  He does not have insurance , so will need medication assist at dc, if dc over the weekend , NCM will have to ast him with [employer].  NCM will cont to follow for dc needs.                 Action/Plan:   Expected Discharge Date:                  Expected Discharge Plan:  Home/Self Care  In-House Referral:     Discharge planning Services  CM Consult, Medication Assistance, Indigent Health Clinic  Post Acute Care Choice:    Choice offered to:     DME Arranged:    DME Agency:     HH Arranged:    HH Agency:     Status of Service:  In process, will continue to follow  If discussed at Long Length of Stay Meetings, dates discussed:    Additional Comments:  Leone Havenaylor, Aarib Pulido [place], RN 09/26/2016, 3:48 PM

## 2016-09-27 DIAGNOSIS — L8990 Pressure ulcer of unspecified site, unspecified stage: Secondary | ICD-10-CM | POA: Diagnosis present

## 2016-09-27 DIAGNOSIS — A419 Sepsis, unspecified organism: Principal | ICD-10-CM

## 2016-09-27 LAB — CBC
HCT: 29.3 % — ABNORMAL LOW (ref 39.0–52.0)
Hemoglobin: 9.5 g/dL — ABNORMAL LOW (ref 13.0–17.0)
MCH: 33.8 pg (ref 26.0–34.0)
MCHC: 32.4 g/dL (ref 30.0–36.0)
MCV: 104.3 fL — AB (ref 78.0–100.0)
Platelets: 90 10*3/uL — ABNORMAL LOW (ref 150–400)
RBC: 2.81 MIL/uL — AB (ref 4.22–5.81)
RDW: 15.5 % (ref 11.5–15.5)
WBC: 3.5 10*3/uL — AB (ref 4.0–10.5)

## 2016-09-27 LAB — COMPREHENSIVE METABOLIC PANEL
ALK PHOS: 63 U/L (ref 38–126)
ALT: 24 U/L (ref 17–63)
AST: 59 U/L — AB (ref 15–41)
Albumin: 2.4 g/dL — ABNORMAL LOW (ref 3.5–5.0)
Anion gap: 9 (ref 5–15)
BILIRUBIN TOTAL: 1.6 mg/dL — AB (ref 0.3–1.2)
BUN: 15 mg/dL (ref 6–20)
CALCIUM: 8.4 mg/dL — AB (ref 8.9–10.3)
CHLORIDE: 109 mmol/L (ref 101–111)
CO2: 25 mmol/L (ref 22–32)
CREATININE: 0.66 mg/dL (ref 0.61–1.24)
Glucose, Bld: 132 mg/dL — ABNORMAL HIGH (ref 65–99)
Potassium: 3.1 mmol/L — ABNORMAL LOW (ref 3.5–5.1)
Sodium: 143 mmol/L (ref 135–145)
Total Protein: 6.2 g/dL — ABNORMAL LOW (ref 6.5–8.1)

## 2016-09-27 LAB — GLUCOSE, CAPILLARY
GLUCOSE-CAPILLARY: 138 mg/dL — AB (ref 65–99)
GLUCOSE-CAPILLARY: 189 mg/dL — AB (ref 65–99)
Glucose-Capillary: 138 mg/dL — ABNORMAL HIGH (ref 65–99)
Glucose-Capillary: 175 mg/dL — ABNORMAL HIGH (ref 65–99)

## 2016-09-27 NOTE — Progress Notes (Signed)
PROGRESS NOTE    Michael Patel  ZOX:[id number] DOB: [birth year] DOA: 10/10/2016  PCP: Ignatius Specking, MD   Brief Narrative:  55/M with h/o seizure d/o, ETOH use, Prior CVA, L sided weakness admitted with Metabolic encephalopathy, CT abd with Mild colitis, some diarrhea inpatient  Assessment & Plan: 1. Sepsis  - due to ? Colitis, low grade fevers on admission with metabolic encephalopathy, CT with colon wall thickening. - improving, pt wants to eat, will advance diet to dys I as recommended after SLP eval  - continue levaquin/flagyl - await GI pathogen panel - UA/CXR/FLu negative              2. Metabolic encephalopathy - due to above, improving - ammonia normal, CT with old CVA and diffuse atrophy - SLP eval done dys I diet recommended   3. Alcoholic liver disease, pancytopenia  - Has low albumin, platelets, history of alcohol use. Ct with hepatic steatosis - continue thiamine, now drinks small amounts daily only, no withdrawal noted - monitor CIWA  4. Seizure disorder - Continue Keppra, gabapentin  5. Hypertension, essential  - Continue metoprolol and aspirin  6. COPD - stable, Continue Dulera/Albuterol PRN   7. Depression - Continue Celexa  8. Hypokalemia and hypomagnesemia  - will supplement and will repeat BMP and Mg level in AM  DVT prophylaxis: Lovenox SQ Code Status: FULL  Family Communication: no family at bedside this AM Disposition Plan: Home in 2-3 days when pt eating better and more alert    Antimicrobials: Levaquin/FLagyl 1/17>  Subjective: More awake, some more diarrhea yesterday  Objective: Vitals:   09/27/16 0356 09/27/16 0818 09/27/16 1137 09/27/16 1703  BP: (!) 153/101 (!) 148/99 (!) 139/92 (!) 142/95  Pulse: 95 94 98 95  Resp: 16 (!) 21 (!) 27 (!) 25  Temp: 99.8 F (37.7 C) 98.9 F (37.2 C) 98.8 F (37.1 C) 100 F (37.8 C)  TempSrc: Axillary Oral Oral Oral  SpO2: 99% 98% 99% 99%  Weight:      Height:        Intake/Output  Summary (Last 24 hours) at 09/27/16 1937 Last data filed at 09/27/16 1700  Gross per 24 hour  Intake             1750 ml  Output             1250 ml  Net              500 ml   Filed Weights   09/25/16 0033 09/25/16 0700 09/25/16 1602  Weight: 89.4 kg (197 lb) 79.9 kg (176 lb 1.6 oz) 79.3 kg (174 lb 13.2 oz)    Examination:  General exam: Alert, awake today, able to converse, oriented to self and place  Respiratory system: Clear to auscultation. Respiratory effort normal. Cardiovascular system: S1 & S2 heard, RRR Gastrointestinal system: Abdomen is nondistended, soft and nontender. Normal bowel sounds heard. Central nervous system: more lucid today No focal neurological deficits. Extremities: Symmetric 5 x 5 power. Skin: No rashes, lesions or ulcers Psychiatry: flat affect  Data Reviewed: I have personally reviewed following labs and imaging studies  CBC:  Recent Labs Lab 09/25/16 0013 09/26/16 0247 09/27/16 0223  WBC 5.8 3.5* 3.5*  NEUTROABS 4.9  --   --   HGB 12.3* 9.6* 9.5*  HCT 36.6* 28.7* 29.3*  MCV 102.8* 103.6* 104.3*  PLT 125* 99* 90*   Basic Metabolic Panel:  Recent Labs Lab 09/25/16 0013 09/25/16 0529 09/26/16 0247 09/27/16  0223  NA 138  --  144 143  K 3.2*  --  3.4* 3.1*  CL 97*  --  107 109  CO2 25  --  28 25  GLUCOSE 148*  --  118* 132*  BUN 42*  --  23* 15  CREATININE 1.06  --  0.76 0.66  CALCIUM 9.3  --  8.4* 8.4*  MG  --  0.9*  --   --    Liver Function Tests:  Recent Labs Lab 09/25/16 0013 09/26/16 0247 09/27/16 0223  AST 29 58* 59*  ALT 15* 20 24  ALKPHOS 50 53 63  BILITOT 2.3* 1.8* 1.6*  PROT 7.9 6.3* 6.2*  ALBUMIN 3.0* 2.4* 2.4*    Recent Labs Lab 09/25/16 0013  LIPASE 28    Recent Labs Lab 09/25/16 0015 09/25/16 1145  AMMONIA 36* 33   Coagulation Profile:  Recent Labs Lab 09/25/16 0529  INR 1.12   CBG:  Recent Labs Lab 09/26/16 1740 09/26/16 2102 09/27/16 0820 09/27/16 1139 09/27/16 1702  GLUCAP  142* 140* 138* 189* 138*   Urine analysis:    Component Value Date/Time   COLORURINE AMBER (A) 09/25/2016 0058   APPEARANCEUR HAZY (A) 09/25/2016 0058   LABSPEC 1.027 09/25/2016 0058   PHURINE 5.0 09/25/2016 0058   GLUCOSEU NEGATIVE 09/25/2016 0058   HGBUR NEGATIVE 09/25/2016 0058   BILIRUBINUR SMALL (A) 09/25/2016 0058   KETONESUR 5 (A) 09/25/2016 0058   PROTEINUR 30 (A) 09/25/2016 0058   NITRITE NEGATIVE 09/25/2016 0058   LEUKOCYTESUR NEGATIVE 09/25/2016 0058   Recent Results (from the past 240 hour(s))  Blood culture (routine x 2)     Status: None (Preliminary result)   Collection Time: 09/25/16 12:27 AM  Result Value Ref Range Status   Specimen Description BLOOD RIGHT HAND  Final   Special Requests IN PEDIATRIC BOTTLE  1CC  Final   Culture NO GROWTH 2 DAYS  Final   Report Status PENDING  Incomplete  Blood culture (routine x 2)     Status: None (Preliminary result)   Collection Time: 09/25/16  1:21 AM  Result Value Ref Range Status   Specimen Description BLOOD LEFT ARM  Final   Special Requests BOTTLES DRAWN AEROBIC AND ANAEROBIC  9CC  Final   Culture NO GROWTH 2 DAYS  Final   Report Status PENDING  Incomplete  Respiratory Panel by PCR     Status: None   Collection Time: 09/25/16  3:41 PM  Result Value Ref Range Status   Adenovirus NOT DETECTED NOT DETECTED Final   Coronavirus 229E NOT DETECTED NOT DETECTED Final   Coronavirus HKU1 NOT DETECTED NOT DETECTED Final   Coronavirus NL63 NOT DETECTED NOT DETECTED Final   Coronavirus OC43 NOT DETECTED NOT DETECTED Final   Metapneumovirus NOT DETECTED NOT DETECTED Final   Rhinovirus / Enterovirus NOT DETECTED NOT DETECTED Final   Influenza A NOT DETECTED NOT DETECTED Final   Influenza B NOT DETECTED NOT DETECTED Final   Parainfluenza Virus 1 NOT DETECTED NOT DETECTED Final   Parainfluenza Virus 2 NOT DETECTED NOT DETECTED Final   Parainfluenza Virus 3 NOT DETECTED NOT DETECTED Final   Parainfluenza Virus 4 NOT DETECTED NOT  DETECTED Final   Respiratory Syncytial Virus NOT DETECTED NOT DETECTED Final   Bordetella pertussis NOT DETECTED NOT DETECTED Final   Chlamydophila pneumoniae NOT DETECTED NOT DETECTED Final   Mycoplasma pneumoniae NOT DETECTED NOT DETECTED Final  MRSA PCR Screening     Status: None   Collection  Time: 09/25/16  4:00 PM  Result Value Ref Range Status   MRSA by PCR NEGATIVE NEGATIVE Final    Comment:        The GeneXpert MRSA Assay (FDA approved for NASAL specimens only), is one component of a comprehensive MRSA colonization surveillance program. It is not intended to diagnose MRSA infection nor to guide or monitor treatment for MRSA infections.       Radiology Studies:  Scheduled Meds: . aspirin EC  81 mg Oral Daily  . citalopram  20 mg Oral QHS  . folic acid  1 mg Oral Daily  . gabapentin  100 mg Oral QHS  . insulin aspart  0-9 Units Subcutaneous TID WC  . levETIRAcetam  500 mg Oral BID  . levofloxacin  500 mg Oral Daily  . metoprolol succinate  50 mg Oral Daily  . metroNIDAZOLE  500 mg Oral Q8H  . mometasone-formoterol  2 puff Inhalation BID  . multivitamin with minerals  1 tablet Oral Daily  . pantoprazole  40 mg Oral Daily  . thiamine  100 mg Oral Daily   Continuous Infusions: . 0.9 % NaCl with KCl 20 mEq / L 50 mL/hr at 09/27/16 1307    LOS: 2 days   Time spent:  Debbora Presto, MD  [facility] Pager [phone]  If 7PM-7AM, please contact night-coverage [email or web address] Password [email or web address]   If 7PM-7AM, please contact night-coverage [email or web address] Password [facility] 09/27/2016, 7:37 PM

## 2016-09-28 LAB — GASTROINTESTINAL PANEL BY PCR, STOOL (REPLACES STOOL CULTURE)
ADENOVIRUS F40/41: NOT DETECTED
ASTROVIRUS: NOT DETECTED
CYCLOSPORA CAYETANENSIS: NOT DETECTED
Campylobacter species: NOT DETECTED
Cryptosporidium: NOT DETECTED
ENTAMOEBA HISTOLYTICA: NOT DETECTED
ENTEROPATHOGENIC E COLI (EPEC): NOT DETECTED
ENTEROTOXIGENIC E COLI (ETEC): NOT DETECTED
Enteroaggregative E coli (EAEC): NOT DETECTED
GIARDIA LAMBLIA: NOT DETECTED
Norovirus GI/GII: NOT DETECTED
Plesimonas shigelloides: NOT DETECTED
Rotavirus A: NOT DETECTED
Salmonella species: NOT DETECTED
Sapovirus (I, II, IV, and V): NOT DETECTED
Shiga like toxin producing E coli (STEC): NOT DETECTED
Shigella/Enteroinvasive E coli (EIEC): NOT DETECTED
VIBRIO CHOLERAE: NOT DETECTED
VIBRIO SPECIES: NOT DETECTED
Yersinia enterocolitica: NOT DETECTED

## 2016-09-28 LAB — COMPREHENSIVE METABOLIC PANEL
ALBUMIN: 2.4 g/dL — AB (ref 3.5–5.0)
ALT: 21 U/L (ref 17–63)
AST: 39 U/L (ref 15–41)
Alkaline Phosphatase: 64 U/L (ref 38–126)
Anion gap: 11 (ref 5–15)
BUN: 15 mg/dL (ref 6–20)
CHLORIDE: 111 mmol/L (ref 101–111)
CO2: 23 mmol/L (ref 22–32)
Calcium: 8.5 mg/dL — ABNORMAL LOW (ref 8.9–10.3)
Creatinine, Ser: 0.62 mg/dL (ref 0.61–1.24)
GFR calc Af Amer: >60 mL/min (ref >60)
GFR calc non Af Amer: >60 mL/min (ref >60)
GLUCOSE: 139 mg/dL — AB (ref 65–99)
POTASSIUM: 3.1 mmol/L — AB (ref 3.5–5.1)
Sodium: 145 mmol/L (ref 135–145)
Total Bilirubin: 1.4 mg/dL — ABNORMAL HIGH (ref 0.3–1.2)
Total Protein: 6.3 g/dL — ABNORMAL LOW (ref 6.5–8.1)

## 2016-09-28 LAB — CBC
HCT: 30.6 % — ABNORMAL LOW (ref 39.0–52.0)
Hemoglobin: 9.9 g/dL — ABNORMAL LOW (ref 13.0–17.0)
MCH: 33.8 pg (ref 26.0–34.0)
MCHC: 32.4 g/dL (ref 30.0–36.0)
MCV: 104.4 fL — ABNORMAL HIGH (ref 78.0–100.0)
PLATELETS: 112 10*3/uL — AB (ref 150–400)
RBC: 2.93 MIL/uL — ABNORMAL LOW (ref 4.22–5.81)
RDW: 15.6 % — AB (ref 11.5–15.5)
WBC: 5.8 10*3/uL (ref 4.0–10.5)

## 2016-09-28 LAB — GLUCOSE, CAPILLARY
GLUCOSE-CAPILLARY: 143 mg/dL — AB (ref 65–99)
Glucose-Capillary: 151 mg/dL — ABNORMAL HIGH (ref 65–99)
Glucose-Capillary: 185 mg/dL — ABNORMAL HIGH (ref 65–99)
Glucose-Capillary: 155 mg/dL — ABNORMAL HIGH (ref 65–99)

## 2016-09-28 LAB — PHOSPHORUS: Phosphorus: 1.4 mg/dL — ABNORMAL LOW (ref 2.5–4.6)

## 2016-09-28 LAB — MAGNESIUM: MAGNESIUM: 1.2 mg/dL — AB (ref 1.7–2.4)

## 2016-09-28 MED ORDER — SODIUM CHLORIDE 0.9 % IV SOLN
500.0000 mg | Freq: Two times a day (BID) | INTRAVENOUS | Status: DC
  Administered 2016-09-28 – 2016-10-01 (×6): 500 mg via INTRAVENOUS
  Filled 2016-09-28 (×7): qty 5

## 2016-09-28 MED ORDER — THIAMINE HCL 100 MG/ML IJ SOLN
100.0000 mg | Freq: Every day | INTRAMUSCULAR | Status: DC
  Administered 2016-09-28 – 2016-09-30 (×3): 100 mg via INTRAVENOUS
  Filled 2016-09-28 (×3): qty 2

## 2016-09-28 MED ORDER — METRONIDAZOLE IN NACL 5-0.79 MG/ML-% IV SOLN
500.0000 mg | Freq: Three times a day (TID) | INTRAVENOUS | Status: DC
  Administered 2016-09-28 – 2016-09-30 (×7): 500 mg via INTRAVENOUS
  Filled 2016-09-28 (×7): qty 100

## 2016-09-28 MED ORDER — LEVOFLOXACIN IN D5W 500 MG/100ML IV SOLN
500.0000 mg | INTRAVENOUS | Status: DC
  Administered 2016-09-28 – 2016-09-30 (×3): 500 mg via INTRAVENOUS
  Filled 2016-09-28 (×3): qty 100

## 2016-09-28 MED ORDER — POTASSIUM PHOSPHATES 15 MMOLE/5ML IV SOLN
40.0000 meq | Freq: Once | INTRAVENOUS | Status: AC
  Administered 2016-09-28: 40 meq via INTRAVENOUS
  Filled 2016-09-28: qty 9.09

## 2016-09-28 MED ORDER — FOLIC ACID 5 MG/ML IJ SOLN
1.0000 mg | Freq: Every day | INTRAMUSCULAR | Status: DC
  Administered 2016-09-28 – 2016-09-30 (×3): 1 mg via INTRAVENOUS
  Filled 2016-09-28 (×4): qty 0.2

## 2016-09-28 MED ORDER — METOPROLOL TARTRATE 25 MG PO TABS
25.0000 mg | ORAL_TABLET | Freq: Two times a day (BID) | ORAL | Status: DC
  Administered 2016-09-28 – 2016-09-30 (×5): 25 mg via ORAL
  Filled 2016-09-28 (×6): qty 1

## 2016-09-28 MED ORDER — MAGNESIUM SULFATE 2 GM/50ML IV SOLN
2.0000 g | Freq: Once | INTRAVENOUS | Status: AC
  Administered 2016-09-28: 2 g via INTRAVENOUS
  Filled 2016-09-28: qty 50

## 2016-09-28 NOTE — Evaluation (Signed)
Physical Therapy Evaluation Patient Details Name: Michael Patel MRN: [id number] DOB: 1961/08/24 Today's Date: 09/28/2016   History of Present Illness  Pt is a 56 y/o male admitted from home secondary to confusion and found to be septic. PMH including but not limited to seizure disorder, prior CVA with L sided weakness, ETOH use, HTN, COPD and depression  Clinical Impression  Pt presented supine in bed with HOB elevated, awake and willing to participate in therapy session. No caregiver or family member was present during evaluation and therefore uncertain of accuracy of information provided. Prior to admission, pt reported that he needed assistance from his wife for ADLs and uses a manual w/c for mobility. Pt currently requires total A x2 for bed mobility. Upon rolling, pt was found to have had a BM and was then cleaned. At that time pt was very fatigued and therefore the evaluation was limited. All VSS throughout. Pt would continue to benefit from skilled physical therapy services at this time while admitted and after d/c to address his below listed limitations in order to improve his overall safety and independence with functional mobility.      Follow Up Recommendations SNF;Supervision/Assistance - 24 hour    Equipment Recommendations  None recommended by PT    Recommendations for Other Services       Precautions / Restrictions Precautions Precautions: Fall Restrictions Weight Bearing Restrictions: No      Mobility  Bed Mobility Overal bed mobility: Needs Assistance Bed Mobility: Rolling Rolling: Total assist;+2 for physical assistance         General bed mobility comments: pt required total A for all aspects and hand-over-hand assist with bilateral UE positioning on bed rails. Pt's bilateral LEs were very stiff and painful throughout.  Transfers                    Ambulation/Gait                Stairs            Wheelchair Mobility    Modified  Rankin (Stroke Patients Only)       Balance                                             Pertinent Vitals/Pain Pain Assessment: Faces Faces Pain Scale: Hurts little more Pain Location: buttocks, bilateral LEs and feet Pain Descriptors / Indicators: Sore Pain Intervention(s): Monitored during session    Home Living Family/patient expects to be discharged to:: Private residence Living Arrangements: Spouse/significant other Available Help at Discharge: Family;Available PRN/intermittently Type of Home: House Home Access: Ramped entrance       Home Equipment: Walker - 2 wheels;Wheelchair - manual Additional Comments: Information regarding home living provided by pt with no caregiver or family present to verify information. Pt somewhat confused throughout evaluation and provided limited information.     Prior Function Level of Independence: Needs assistance   Gait / Transfers Assistance Needed: pt stated that he uses a manual w/c for mobility that he can mobilize himself. He also stated that he could transfer into and out of his w/c independently (uncertain of accuracy as no caregiver or family member present)  ADL's / Homemaking Assistance Needed: pt reported that his wife assists him with bathing and dressing        Hand Dominance  Extremity/Trunk Assessment   Upper Extremity Assessment Upper Extremity Assessment: Generalized weakness;RUE deficits/detail;LUE deficits/detail RUE Deficits / Details: pt with very minimal movement of UE and required hand-over-hand assist during bed mobility to reach for bed rails LUE Deficits / Details: pt with very minimal movement of UE and required hand-over-hand assist during bed mobility to reach for bed rails    Lower Extremity Assessment Lower Extremity Assessment: Generalized weakness;RLE deficits/detail;LLE deficits/detail RLE Deficits / Details: pt with no active movements of LEs during evaluation either  spontaneously or with VC's LLE Deficits / Details: pt with no active movements of LEs during evaluation either spontaneously or with VC's    Cervical / Trunk Assessment Cervical / Trunk Assessment: Other exceptions Cervical / Trunk Exceptions: pt with preference to maintain head in flexion and R lateral flexion. Very minimal cervical AROM in all planes of motion.  Communication   Communication: HOH  Cognition Arousal/Alertness: Awake/alert Behavior During Therapy: Flat affect Overall Cognitive Status: No family/caregiver present to determine baseline cognitive functioning Area of Impairment: Memory;Following commands;Awareness;Problem solving     Memory: Decreased short-term memory Following Commands: Follows one step commands inconsistently;Follows one step commands with increased time   Awareness: Emergent Problem Solving: Slow processing;Decreased initiation;Difficulty sequencing;Requires verbal cues;Requires tactile cues      General Comments      Exercises     Assessment/Plan    PT Assessment Patient needs continued PT services  PT Problem List Decreased strength;Decreased range of motion;Decreased activity tolerance;Decreased balance;Decreased mobility;Decreased coordination;Decreased cognition;Decreased knowledge of precautions;Decreased safety awareness;Decreased knowledge of use of DME;Pain          PT Treatment Interventions DME instruction;Functional mobility training;Therapeutic activities;Therapeutic exercise;Balance training;Neuromuscular re-education;Cognitive remediation;Patient/family education;Wheelchair mobility training    PT Goals (Current goals can be found in the Care Plan section)  Acute Rehab PT Goals Patient Stated Goal: none stated PT Goal Formulation: With patient Time For Goal Achievement: 10/12/16 Potential to Achieve Goals: Fair    Frequency Min 2X/week   Barriers to discharge        Co-evaluation               End of Session    Activity Tolerance: Patient limited by pain;Patient limited by fatigue Patient left: in bed;with call bell/phone within reach;with bed alarm set Nurse Communication: Mobility status;Need for lift equipment         Time: [id number] PT Time Calculation (min) (ACUTE ONLY): 29 min   Charges:   PT Evaluation $PT Eval Moderate Complexity: 1 Procedure PT Treatments $Therapeutic Activity: 8-22 mins   PT G CodesAlessandra Bevels:        Quentina Fronek M Thomes Burak 09/28/2016, 11:17 AM Deborah ChalkJennifer Dorn Hartshorne, PT, DPT [phone]

## 2016-09-28 NOTE — Progress Notes (Signed)
PROGRESS NOTE    Michael Patel  WUJ:[id number] DOB: 26-Mar-1961 DOA: 10/09/2016  PCP: Ignatius Specking, MD   Brief Narrative:  55/M with h/o seizure d/o, ETOH use, Prior CVA, L sided weakness admitted with Metabolic encephalopathy, CT abd with Mild colitis, some diarrhea inpatient  Assessment & Plan: 1. Sepsis  - due to ? Colitis, low grade fevers on admission with metabolic encephalopathy, CT with colon wall thickening. - improving, pt wants to eat, will advance diet to dys I as recommended after SLP eval  - pt still with low grade fevers up to 100 F in the past 24 hours  - continue levaquin/flagyl for now and change to PO as pt only able to take dys I diet and recommendation was to crush pills but these ABX can not be crushed  - GI stool panel negative  - UA/CXR/FLu negative              2. Metabolic encephalopathy - due to above, improving rather slowly  - ammonia normal, CT with old CVA and diffuse atrophy - SLP eval done dys I diet recommended  - PT eval done, SNF recommended, SW consulted for assistance   3. Alcoholic liver disease, pancytopenia  - Has low albumin, platelets, history of alcohol use. Ct with hepatic steatosis - continue thiamine, now drinks small amounts daily only, no withdrawal noted - monitor CIWA - blood counts in all three cell lines improving   4. Seizure disorder - Continue Keppra, gabapentin  5. Hypertension, essential  - Continue metoprolol and aspirin  6. COPD - stable, Continue Dulera/Albuterol PRN   7. Depression - Continue Celexa  8. Hypokalemia and hypomagnesemia, hypophosphatemia  - all three electrolytes low, supplement and check again in AM  9. Acute functional quadriplegia - from acute on chronic illness - will need PT and SNF upon discharge   DVT prophylaxis: Lovenox SQ Code Status: FULL  Family Communication: no family at bedside this AM Disposition Plan: Home in 2-3 days when pt eating better and more alert     Antimicrobials: Levaquin/FLagyl 1/17>  Subjective: More awake, some more diarrhea yesterday  Objective: Vitals:   09/27/16 2343 09/28/16 0327 09/28/16 0726 09/28/16 1130  BP: (!) 132/93 (!) 148/101 129/83 (!) 143/94  Pulse: 83 (!) 108 (!) 107 100  Resp: (!) 21 (!) 26 (!) 23 16  Temp: 99.9 F (37.7 C) 97.7 F (36.5 C) 99.3 F (37.4 C) 99.5 F (37.5 C)  TempSrc: Oral Oral Oral Oral  SpO2: 98% 99% 95% 99%  Weight:      Height:        Intake/Output Summary (Last 24 hours) at 09/28/16 1146 Last data filed at 09/28/16 0729  Gross per 24 hour  Intake             1300 ml  Output             1000 ml  Net              300 ml   Filed Weights   09/25/16 0033 09/25/16 0700 09/25/16 1602  Weight: 89.4 kg (197 lb) 79.9 kg (176 lb 1.6 oz) 79.3 kg (174 lb 13.2 oz)    Examination:  General exam: Alert, awake today, able to converse, oriented to self and place  Respiratory system: Clear to auscultation. Respiratory effort normal. Cardiovascular system: S1 & S2 heard, RRR Gastrointestinal system: Abdomen is nondistended, soft and nontender. Normal bowel sounds heard. Central nervous system: more lucid today No  focal neurological deficits.  Data Reviewed: I have personally reviewed following labs and imaging studies  CBC:  Recent Labs Lab 09/25/16 0013 09/26/16 0247 09/27/16 0223 09/28/16 0456  WBC 5.8 3.5* 3.5* 5.8  NEUTROABS 4.9  --   --   --   HGB 12.3* 9.6* 9.5* 9.9*  HCT 36.6* 28.7* 29.3* 30.6*  MCV 102.8* 103.6* 104.3* 104.4*  PLT 125* 99* 90* 112*   Basic Metabolic Panel:  Recent Labs Lab 09/25/16 0013 09/25/16 0529 09/26/16 0247 09/27/16 0223 09/28/16 0456  NA 138  --  144 143 145  K 3.2*  --  3.4* 3.1* 3.1*  CL 97*  --  107 109 111  CO2 25  --  28 25 23   GLUCOSE 148*  --  118* 132* 139*  BUN 42*  --  23* 15 15  CREATININE 1.06  --  0.76 0.66 0.62  CALCIUM 9.3  --  8.4* 8.4* 8.5*  MG  --  0.9*  --   --  1.2*  PHOS  --   --   --   --  1.4*    Liver Function Tests:  Recent Labs Lab 09/25/16 0013 09/26/16 0247 09/27/16 0223 09/28/16 0456  AST 29 58* 59* 39  ALT 15* 20 24 21   ALKPHOS 50 53 63 64  BILITOT 2.3* 1.8* 1.6* 1.4*  PROT 7.9 6.3* 6.2* 6.3*  ALBUMIN 3.0* 2.4* 2.4* 2.4*    Recent Labs Lab 09/25/16 0013  LIPASE 28    Recent Labs Lab 09/25/16 0015 09/25/16 1145  AMMONIA 36* 33   Coagulation Profile:  Recent Labs Lab 09/25/16 0529  INR 1.12   CBG:  Recent Labs Lab 09/27/16 1139 09/27/16 1702 09/27/16 2202 09/28/16 0726 09/28/16 1130  GLUCAP 189* 138* 175* 143* 151*   Urine analysis:    Component Value Date/Time   COLORURINE AMBER (A) 09/25/2016 0058   APPEARANCEUR HAZY (A) 09/25/2016 0058   LABSPEC 1.027 09/25/2016 0058   PHURINE 5.0 09/25/2016 0058   GLUCOSEU NEGATIVE 09/25/2016 0058   HGBUR NEGATIVE 09/25/2016 0058   BILIRUBINUR SMALL (A) 09/25/2016 0058   KETONESUR 5 (A) 09/25/2016 0058   PROTEINUR 30 (A) 09/25/2016 0058   NITRITE NEGATIVE 09/25/2016 0058   LEUKOCYTESUR NEGATIVE 09/25/2016 0058   Recent Results (from the past 240 hour(s))  Blood culture (routine x 2)     Status: None (Preliminary result)   Collection Time: 09/25/16 12:27 AM  Result Value Ref Range Status   Specimen Description BLOOD RIGHT HAND  Final   Special Requests IN PEDIATRIC BOTTLE  1CC  Final   Culture NO GROWTH 3 DAYS  Final   Report Status PENDING  Incomplete  Blood culture (routine x 2)     Status: None (Preliminary result)   Collection Time: 09/25/16  1:21 AM  Result Value Ref Range Status   Specimen Description BLOOD LEFT ARM  Final   Special Requests BOTTLES DRAWN AEROBIC AND ANAEROBIC  9CC  Final   Culture NO GROWTH 3 DAYS  Final   Report Status PENDING  Incomplete  Respiratory Panel by PCR     Status: None   Collection Time: 09/25/16  3:41 PM  Result Value Ref Range Status   Adenovirus NOT DETECTED NOT DETECTED Final   Coronavirus 229E NOT DETECTED NOT DETECTED Final   Coronavirus  HKU1 NOT DETECTED NOT DETECTED Final   Coronavirus NL63 NOT DETECTED NOT DETECTED Final   Coronavirus OC43 NOT DETECTED NOT DETECTED Final   Metapneumovirus NOT DETECTED  NOT DETECTED Final   Rhinovirus / Enterovirus NOT DETECTED NOT DETECTED Final   Influenza A NOT DETECTED NOT DETECTED Final   Influenza B NOT DETECTED NOT DETECTED Final   Parainfluenza Virus 1 NOT DETECTED NOT DETECTED Final   Parainfluenza Virus 2 NOT DETECTED NOT DETECTED Final   Parainfluenza Virus 3 NOT DETECTED NOT DETECTED Final   Parainfluenza Virus 4 NOT DETECTED NOT DETECTED Final   Respiratory Syncytial Virus NOT DETECTED NOT DETECTED Final   Bordetella pertussis NOT DETECTED NOT DETECTED Final   Chlamydophila pneumoniae NOT DETECTED NOT DETECTED Final   Mycoplasma pneumoniae NOT DETECTED NOT DETECTED Final  MRSA PCR Screening     Status: None   Collection Time: 09/25/16  4:00 PM  Result Value Ref Range Status   MRSA by PCR NEGATIVE NEGATIVE Final    Comment:        The GeneXpert MRSA Assay (FDA approved for NASAL specimens only), is one component of a comprehensive MRSA colonization surveillance program. It is not intended to diagnose MRSA infection nor to guide or monitor treatment for MRSA infections.       Radiology Studies:  Scheduled Meds: . aspirin EC  81 mg Oral Daily  . folic acid  1 mg Intravenous Daily  . gabapentin  100 mg Oral QHS  . insulin aspart  0-9 Units Subcutaneous TID WC  . levETIRAcetam  500 mg Intravenous Q12H  . levofloxacin (LEVAQUIN) IV  500 mg Intravenous Q24H  . metoprolol tartrate  25 mg Oral BID  . metronidazole  500 mg Intravenous Q8H  . mometasone-formoterol  2 puff Inhalation BID  . pantoprazole  40 mg Oral Daily  . thiamine injection  100 mg Intravenous Daily   Continuous Infusions: . 0.9 % NaCl with KCl 20 mEq / L 50 mL/hr at 09/28/16 0000    LOS: 3 days   Time spent:  Debbora Presto, MD  [facility] Pager [phone]  If 7PM-7AM,  please contact night-coverage [email or web address] Password [email or web address]   If 7PM-7AM, please contact night-coverage [email or web address] Password [email or web address] 09/28/2016, 11:46 AM

## 2016-09-29 ENCOUNTER — Inpatient Hospital Stay (HOSPITAL_COMMUNITY): Payer: Self-pay

## 2016-09-29 LAB — GLUCOSE, CAPILLARY
GLUCOSE-CAPILLARY: 163 mg/dL — AB (ref 65–99)
GLUCOSE-CAPILLARY: 166 mg/dL — AB (ref 65–99)
Glucose-Capillary: 154 mg/dL — ABNORMAL HIGH (ref 65–99)
Glucose-Capillary: 161 mg/dL — ABNORMAL HIGH (ref 65–99)

## 2016-09-29 LAB — BASIC METABOLIC PANEL
ANION GAP: 9 (ref 5–15)
BUN: 19 mg/dL (ref 6–20)
CHLORIDE: 106 mmol/L (ref 101–111)
CO2: 27 mmol/L (ref 22–32)
Calcium: 8.4 mg/dL — ABNORMAL LOW (ref 8.9–10.3)
Creatinine, Ser: 0.67 mg/dL (ref 0.61–1.24)
GFR calc Af Amer: >60 mL/min (ref >60)
GLUCOSE: 154 mg/dL — AB (ref 65–99)
POTASSIUM: 3.1 mmol/L — AB (ref 3.5–5.1)
Sodium: 142 mmol/L (ref 135–145)

## 2016-09-29 LAB — CBC
HEMATOCRIT: 31.8 % — AB (ref 39.0–52.0)
HEMOGLOBIN: 10.3 g/dL — AB (ref 13.0–17.0)
MCH: 34 pg (ref 26.0–34.0)
MCHC: 32.4 g/dL (ref 30.0–36.0)
MCV: 105 fL — AB (ref 78.0–100.0)
PLATELETS: 136 10*3/uL — AB (ref 150–400)
RBC: 3.03 MIL/uL — AB (ref 4.22–5.81)
RDW: 15.8 % — ABNORMAL HIGH (ref 11.5–15.5)
WBC: 7.4 10*3/uL (ref 4.0–10.5)

## 2016-09-29 LAB — PHOSPHORUS: Phosphorus: 3.7 mg/dL (ref 2.5–4.6)

## 2016-09-29 LAB — MAGNESIUM: Magnesium: 1.6 mg/dL — ABNORMAL LOW (ref 1.7–2.4)

## 2016-09-29 MED ORDER — METOPROLOL TARTRATE 5 MG/5ML IV SOLN
5.0000 mg | Freq: Four times a day (QID) | INTRAVENOUS | Status: DC | PRN
  Administered 2016-09-29: 5 mg via INTRAVENOUS
  Filled 2016-09-29: qty 5

## 2016-09-29 MED ORDER — MAGNESIUM SULFATE 2 GM/50ML IV SOLN
2.0000 g | Freq: Once | INTRAVENOUS | Status: AC
  Administered 2016-09-29: 2 g via INTRAVENOUS
  Filled 2016-09-29: qty 50

## 2016-09-29 MED ORDER — FUROSEMIDE 10 MG/ML IJ SOLN
40.0000 mg | Freq: Once | INTRAMUSCULAR | Status: AC
  Administered 2016-09-29: 40 mg via INTRAVENOUS
  Filled 2016-09-29: qty 4

## 2016-09-29 NOTE — Progress Notes (Addendum)
PROGRESS NOTE    Michael Patel  ZOX:[id number] DOB: May 26, 1961 DOA: 09/13/2016  PCP: Ignatius Specking, MD   Brief Narrative:  55/M with h/o seizure d/o, ETOH use, Prior CVA, L sided weakness admitted with Metabolic encephalopathy, CT abd with Mild colitis, some diarrhea inpatient  Assessment & Plan: Sepsis  - due to ? Colitis, low grade fevers on admission with metabolic encephalopathy, CT with colon wall thickening. - improving, pt wants to eat, will advance diet to dys I as recommended after SLP eval  - pt still with low grade fevers up to 99.5 F in the past 24 hours  - continue levaquin/flagyl for now and changed to IV as pt only able to take dys I diet and recommendation was to crush pills but these ABX can not be crushed  - GI stool panel negative  - UA/CXR/FLu negative  Dyspnea today 1/21 - CT chest with no signs of aspiration - will give dose of lasix as some crackles noted at bases              Metabolic encephalopathy - due to above, improving rather slowly  - ammonia normal, CT with old CVA and diffuse atrophy - SLP eval done dys I diet recommended  - PT eval done, SNF recommended, SW consulted for assistance  - pt more alert this AM   Alcoholic liver disease, pancytopenia  - Has low albumin, platelets, history of alcohol use. Ct with hepatic steatosis - continue thiamine, now drinks small amounts daily only, no withdrawal noted - monitor CIWA - blood counts in all three cell lines improving   Seizure disorder - Continue Keppra, gabapentin  Hypertension, essential  - Continue metoprolol and aspirin  COPD - stable, Continue Dulera/Albuterol PRN   Depression - Continue Celexa  Hypokalemia and hypomagnesemia, hypophosphatemia  - all three electrolytes low, continue to supplement   Acute functional quadriplegia - from acute on chronic illness - will need PT and SNF upon discharge   Abd ascites, ? ileus  - asked IR for paracentesis and send fluid for  analysis  - abd xray also requested to rule out ileus   DVT prophylaxis: Lovenox SQ Code Status: FULL  Family Communication: no family at bedside this AM, called wife on her cell phone and left message to call me back on my cell phone  Disposition Plan: Home in 2-3 days when pt eating better and more alert    Antimicrobials: Levaquin/FLagyl 1/17>  Subjective: More awake, some more diarrhea yesterday  Objective: Vitals:   09/29/16 0700 09/29/16 1200 09/29/16 1300 09/29/16 1500  BP: (!) 163/103 (!) 130/95 (!) 135/91   Pulse: 100 (!) 107    Resp: 13 (!) 22 (!) 22   Temp: 98.2 F (36.8 C) 97.6 F (36.4 C)  97 F (36.1 C)  TempSrc: Oral Oral  Axillary  SpO2: 99%     Weight:      Height:        Intake/Output Summary (Last 24 hours) at 09/29/16 1757 Last data filed at 09/29/16 0104  Gross per 24 hour  Intake              610 ml  Output              175 ml  Net              435 ml   Filed Weights   09/25/16 0033 09/25/16 0700 09/25/16 1602  Weight: 89.4 kg (197 lb) 79.9 kg (176  lb 1.6 oz) 79.3 kg (174 lb 13.2 oz)    Examination:  General exam: Alert, awake today, able to converse, oriented to self and place  Respiratory system: Clear to auscultation. Respiratory effort normal. Cardiovascular system: S1 & S2 heard, tachycardic  Gastrointestinal system: Abdomen is nondistended, soft and nontender. Normal bowel sounds heard. Central nervous system: more lucid today No focal neurological deficits.  Data Reviewed: I have personally reviewed following labs and imaging studies  CBC:  Recent Labs Lab 09/25/16 0013 09/26/16 0247 09/27/16 0223 09/28/16 0456 09/29/16 0218  WBC 5.8 3.5* 3.5* 5.8 7.4  NEUTROABS 4.9  --   --   --   --   HGB 12.3* 9.6* 9.5* 9.9* 10.3*  HCT 36.6* 28.7* 29.3* 30.6* 31.8*  MCV 102.8* 103.6* 104.3* 104.4* 105.0*  PLT 125* 99* 90* 112* 136*   Basic Metabolic Panel:  Recent Labs Lab 09/25/16 0013 09/25/16 0529 09/26/16 0247  09/27/16 0223 09/28/16 0456 09/29/16 0218  NA 138  --  144 143 145 142  K 3.2*  --  3.4* 3.1* 3.1* 3.1*  CL 97*  --  107 109 111 106  CO2 25  --  28 25 23 27   GLUCOSE 148*  --  118* 132* 139* 154*  BUN 42*  --  23* 15 15 19   CREATININE 1.06  --  0.76 0.66 0.62 0.67  CALCIUM 9.3  --  8.4* 8.4* 8.5* 8.4*  MG  --  0.9*  --   --  1.2* 1.6*  PHOS  --   --   --   --  1.4* 3.7   Liver Function Tests:  Recent Labs Lab 09/25/16 0013 09/26/16 0247 09/27/16 0223 09/28/16 0456  AST 29 58* 59* 39  ALT 15* 20 24 21   ALKPHOS 50 53 63 64  BILITOT 2.3* 1.8* 1.6* 1.4*  PROT 7.9 6.3* 6.2* 6.3*  ALBUMIN 3.0* 2.4* 2.4* 2.4*    Recent Labs Lab 09/25/16 0013  LIPASE 28    Recent Labs Lab 09/25/16 0015 09/25/16 1145  AMMONIA 36* 33   Coagulation Profile:  Recent Labs Lab 09/25/16 0529  INR 1.12   CBG:  Recent Labs Lab 09/28/16 1641 09/28/16 2101 09/29/16 0757 09/29/16 1128 09/29/16 1718  GLUCAP 185* 155* 163* 154* 161*   Urine analysis:    Component Value Date/Time   COLORURINE AMBER (A) 09/25/2016 0058   APPEARANCEUR HAZY (A) 09/25/2016 0058   LABSPEC 1.027 09/25/2016 0058   PHURINE 5.0 09/25/2016 0058   GLUCOSEU NEGATIVE 09/25/2016 0058   HGBUR NEGATIVE 09/25/2016 0058   BILIRUBINUR SMALL (A) 09/25/2016 0058   KETONESUR 5 (A) 09/25/2016 0058   PROTEINUR 30 (A) 09/25/2016 0058   NITRITE NEGATIVE 09/25/2016 0058   LEUKOCYTESUR NEGATIVE 09/25/2016 0058   Recent Results (from the past 240 hour(s))  Blood culture (routine x 2)     Status: None (Preliminary result)   Collection Time: 09/25/16 12:27 AM  Result Value Ref Range Status   Specimen Description BLOOD RIGHT HAND  Final   Special Requests IN PEDIATRIC BOTTLE  1CC  Final   Culture NO GROWTH 4 DAYS  Final   Report Status PENDING  Incomplete  Blood culture (routine x 2)     Status: None (Preliminary result)   Collection Time: 09/25/16  1:21 AM  Result Value Ref Range Status   Specimen Description BLOOD  LEFT ARM  Final   Special Requests BOTTLES DRAWN AEROBIC AND ANAEROBIC  9CC  Final   Culture NO GROWTH  4 DAYS  Final   Report Status PENDING  Incomplete  Respiratory Panel by PCR     Status: None   Collection Time: 09/25/16  3:41 PM  Result Value Ref Range Status   Adenovirus NOT DETECTED NOT DETECTED Final   Coronavirus 229E NOT DETECTED NOT DETECTED Final   Coronavirus HKU1 NOT DETECTED NOT DETECTED Final   Coronavirus NL63 NOT DETECTED NOT DETECTED Final   Coronavirus OC43 NOT DETECTED NOT DETECTED Final   Metapneumovirus NOT DETECTED NOT DETECTED Final   Rhinovirus / Enterovirus NOT DETECTED NOT DETECTED Final   Influenza A NOT DETECTED NOT DETECTED Final   Influenza B NOT DETECTED NOT DETECTED Final   Parainfluenza Virus 1 NOT DETECTED NOT DETECTED Final   Parainfluenza Virus 2 NOT DETECTED NOT DETECTED Final   Parainfluenza Virus 3 NOT DETECTED NOT DETECTED Final   Parainfluenza Virus 4 NOT DETECTED NOT DETECTED Final   Respiratory Syncytial Virus NOT DETECTED NOT DETECTED Final   Bordetella pertussis NOT DETECTED NOT DETECTED Final   Chlamydophila pneumoniae NOT DETECTED NOT DETECTED Final   Mycoplasma pneumoniae NOT DETECTED NOT DETECTED Final  MRSA PCR Screening     Status: None   Collection Time: 09/25/16  4:00 PM  Result Value Ref Range Status   MRSA by PCR NEGATIVE NEGATIVE Final    Comment:        The GeneXpert MRSA Assay (FDA approved for NASAL specimens only), is one component of a comprehensive MRSA colonization surveillance program. It is not intended to diagnose MRSA infection nor to guide or monitor treatment for MRSA infections.   Gastrointestinal Panel by PCR , Stool     Status: None   Collection Time: 09/27/16  8:30 AM  Result Value Ref Range Status   Campylobacter species NOT DETECTED NOT DETECTED Final   Plesimonas shigelloides NOT DETECTED NOT DETECTED Final   Salmonella species NOT DETECTED NOT DETECTED Final   Yersinia enterocolitica NOT  DETECTED NOT DETECTED Final   Vibrio species NOT DETECTED NOT DETECTED Final   Vibrio cholerae NOT DETECTED NOT DETECTED Final   Enteroaggregative E coli (EAEC) NOT DETECTED NOT DETECTED Final   Enteropathogenic E coli (EPEC) NOT DETECTED NOT DETECTED Final   Enterotoxigenic E coli (ETEC) NOT DETECTED NOT DETECTED Final   Shiga like toxin producing E coli (STEC) NOT DETECTED NOT DETECTED Final   Shigella/Enteroinvasive E coli (EIEC) NOT DETECTED NOT DETECTED Final   Cryptosporidium NOT DETECTED NOT DETECTED Final   Cyclospora cayetanensis NOT DETECTED NOT DETECTED Final   Entamoeba histolytica NOT DETECTED NOT DETECTED Final   Giardia lamblia NOT DETECTED NOT DETECTED Final   Adenovirus F40/41 NOT DETECTED NOT DETECTED Final   Astrovirus NOT DETECTED NOT DETECTED Final   Norovirus GI/GII NOT DETECTED NOT DETECTED Final   Rotavirus A NOT DETECTED NOT DETECTED Final   Sapovirus (I, II, IV, and V) NOT DETECTED NOT DETECTED Final      Radiology Studies:  Scheduled Meds: . aspirin EC  81 mg Oral Daily  . folic acid  1 mg Intravenous Daily  . gabapentin  100 mg Oral QHS  . insulin aspart  0-9 Units Subcutaneous TID WC  . levETIRAcetam  500 mg Intravenous Q12H  . levofloxacin (LEVAQUIN) IV  500 mg Intravenous Q24H  . metoprolol tartrate  25 mg Oral BID  . metronidazole  500 mg Intravenous Q8H  . mometasone-formoterol  2 puff Inhalation BID  . pantoprazole  40 mg Oral Daily  . thiamine injection  100 mg  Intravenous Daily   Continuous Infusions:   LOS: 4 days   Time spent: 35min  Debbora PrestoMAGICK-MYERS, ISKRA, MD  [facility] Pager [phone]  If 7PM-7AM, please contact night-coverage [email or web address] Password [email or web address]  If 7PM-7AM, please contact night-coverage [email or web address] Password [email or web address] 09/29/2016, 5:57 PM

## 2016-09-29 NOTE — Progress Notes (Signed)
Pt with no urine output bladder scan revealed 250cc. MD paged. No new orders at this time. Will continue to monitor.

## 2016-09-30 ENCOUNTER — Inpatient Hospital Stay (HOSPITAL_COMMUNITY): Payer: Self-pay

## 2016-09-30 LAB — CBC
HCT: 34.1 % — ABNORMAL LOW (ref 39.0–52.0)
HEMATOCRIT: 33 % — AB (ref 39.0–52.0)
HEMOGLOBIN: 10.4 g/dL — AB (ref 13.0–17.0)
Hemoglobin: 11.2 g/dL — ABNORMAL LOW (ref 13.0–17.0)
MCH: 34.5 pg — ABNORMAL HIGH (ref 26.0–34.0)
MCH: 33.9 pg (ref 26.0–34.0)
MCHC: 32.8 g/dL (ref 30.0–36.0)
MCHC: 31.5 g/dL (ref 30.0–36.0)
MCV: 104.9 fL — AB (ref 78.0–100.0)
MCV: 107.5 fL — AB (ref 78.0–100.0)
PLATELETS: 214 10*3/uL (ref 150–400)
Platelets: 250 10*3/uL (ref 150–400)
RBC: 3.25 MIL/uL — ABNORMAL LOW (ref 4.22–5.81)
RBC: 3.07 MIL/uL — ABNORMAL LOW (ref 4.22–5.81)
RDW: 16 % — AB (ref 11.5–15.5)
RDW: 16.5 % — AB (ref 11.5–15.5)
WBC: 12.6 10*3/uL — AB (ref 4.0–10.5)
WBC: 23.7 10*3/uL — AB (ref 4.0–10.5)

## 2016-09-30 LAB — BASIC METABOLIC PANEL
ANION GAP: 15 (ref 5–15)
ANION GAP: 24 — AB (ref 5–15)
BUN: 34 mg/dL — AB (ref 6–20)
BUN: 46 mg/dL — AB (ref 6–20)
CHLORIDE: 104 mmol/L (ref 101–111)
CO2: 26 mmol/L (ref 22–32)
CO2: 18 mmol/L — ABNORMAL LOW (ref 22–32)
Calcium: 8.7 mg/dL — ABNORMAL LOW (ref 8.9–10.3)
Calcium: 8.2 mg/dL — ABNORMAL LOW (ref 8.9–10.3)
Chloride: 104 mmol/L (ref 101–111)
Creatinine, Ser: 1.73 mg/dL — ABNORMAL HIGH (ref 0.61–1.24)
Creatinine, Ser: 3 mg/dL — ABNORMAL HIGH (ref 0.61–1.24)
GFR calc Af Amer: 49 mL/min — ABNORMAL LOW (ref >60)
GFR calc Af Amer: 25 mL/min — ABNORMAL LOW (ref >60)
GFR calc non Af Amer: 22 mL/min — ABNORMAL LOW (ref >60)
GFR, EST NON AFRICAN AMERICAN: 42 mL/min — AB (ref >60)
GLUCOSE: 156 mg/dL — AB (ref 65–99)
Glucose, Bld: 231 mg/dL — ABNORMAL HIGH (ref 65–99)
POTASSIUM: 3.3 mmol/L — AB (ref 3.5–5.1)
Potassium: 3.2 mmol/L — ABNORMAL LOW (ref 3.5–5.1)
SODIUM: 146 mmol/L — AB (ref 135–145)
Sodium: 145 mmol/L (ref 135–145)

## 2016-09-30 LAB — CULTURE, BLOOD (ROUTINE X 2)
CULTURE: NO GROWTH
Culture: NO GROWTH

## 2016-09-30 LAB — POCT I-STAT 3, ART BLOOD GAS (G3+)
ACID-BASE DEFICIT: 5 mmol/L — AB (ref 0.0–2.0)
Bicarbonate: 21.3 mmol/L (ref 20.0–28.0)
O2 Saturation: 100 %
TCO2: 23 mmol/L (ref 0–100)
pCO2 arterial: 43.6 mmHg (ref 32.0–48.0)
pH, Arterial: 7.298 — ABNORMAL LOW (ref 7.350–7.450)
pO2, Arterial: 424 mmHg — ABNORMAL HIGH (ref 83.0–108.0)

## 2016-09-30 LAB — PHOSPHORUS: PHOSPHORUS: 10.5 mg/dL — AB (ref 2.5–4.6)

## 2016-09-30 LAB — LACTIC ACID, PLASMA
Lactic Acid, Venous: 2.3 mmol/L (ref 0.5–1.9)
Lactic Acid, Venous: 2.5 mmol/L (ref 0.5–1.9)

## 2016-09-30 LAB — PROCALCITONIN: PROCALCITONIN: 0.7 ng/mL

## 2016-09-30 LAB — GLUCOSE, CAPILLARY
Glucose-Capillary: 156 mg/dL — ABNORMAL HIGH (ref 65–99)
Glucose-Capillary: 137 mg/dL — ABNORMAL HIGH (ref 65–99)
Glucose-Capillary: 138 mg/dL — ABNORMAL HIGH (ref 65–99)
Glucose-Capillary: 159 mg/dL — ABNORMAL HIGH (ref 65–99)

## 2016-09-30 LAB — MAGNESIUM: Magnesium: 2.7 mg/dL — ABNORMAL HIGH (ref 1.7–2.4)

## 2016-09-30 MED ORDER — PANTOPRAZOLE SODIUM 40 MG IV SOLR
40.0000 mg | Freq: Two times a day (BID) | INTRAVENOUS | Status: DC
  Administered 2016-10-01: 40 mg via INTRAVENOUS
  Filled 2016-09-30: qty 40

## 2016-09-30 MED ORDER — MIDAZOLAM HCL 2 MG/2ML IJ SOLN: 2.0000 mg | INTRAMUSCULAR | Status: DC | PRN

## 2016-09-30 MED ORDER — SODIUM CHLORIDE 0.9 % IV SOLN
INTRAVENOUS | Status: DC
  Administered 2016-09-30: 12:00:00 via INTRAVENOUS

## 2016-09-30 MED ORDER — ASPIRIN 81 MG PO CHEW
81.0000 mg | CHEWABLE_TABLET | Freq: Every day | ORAL | Status: DC
  Administered 2016-09-30: 81 mg via ORAL
  Filled 2016-09-30: qty 1

## 2016-09-30 MED ORDER — BISACODYL 10 MG RE SUPP
10.0000 mg | Freq: Every day | RECTAL | Status: DC
  Administered 2016-09-30: 10 mg via RECTAL
  Filled 2016-09-30: qty 1

## 2016-09-30 MED ORDER — MIDAZOLAM HCL 2 MG/2ML IJ SOLN
2.0000 mg | INTRAMUSCULAR | Status: DC | PRN
  Administered 2016-10-01: 2 mg via INTRAVENOUS
  Filled 2016-09-30: qty 2

## 2016-09-30 MED ORDER — GABAPENTIN 250 MG/5ML PO SOLN
100.0000 mg | Freq: Two times a day (BID) | ORAL | Status: DC
  Administered 2016-10-01: 100 mg
  Filled 2016-09-30: qty 2

## 2016-09-30 MED ORDER — DEXTROSE 5 % IV SOLN
INTRAVENOUS | Status: DC
  Administered 2016-10-01: 01:00:00 via INTRAVENOUS
  Filled 2016-09-30 (×2): qty 150

## 2016-09-30 MED ORDER — FENTANYL CITRATE (PF) 100 MCG/2ML IJ SOLN: 100.0000 ug | INTRAMUSCULAR | Status: DC | PRN

## 2016-09-30 MED ORDER — HEPARIN SODIUM (PORCINE) 5000 UNIT/ML IJ SOLN: 5000.0000 [IU] | Freq: Three times a day (TID) | INTRAMUSCULAR | Status: DC

## 2016-09-30 MED ORDER — FAMOTIDINE IN NACL 20-0.9 MG/50ML-% IV SOLN
20.0000 mg | Freq: Two times a day (BID) | INTRAVENOUS | Status: DC
  Filled 2016-09-30 (×2): qty 50

## 2016-09-30 MED ORDER — ASPIRIN 81 MG PO CHEW: 81.0000 mg | CHEWABLE_TABLET | Freq: Every day | ORAL | Status: DC

## 2016-09-30 MED ORDER — PANTOPRAZOLE SODIUM 40 MG PO PACK
40.0000 mg | PACK | Freq: Every day | ORAL | Status: DC
  Administered 2016-09-30: 40 mg via ORAL
  Filled 2016-09-30: qty 20

## 2016-09-30 MED ORDER — MAGNESIUM SULFATE 2 GM/50ML IV SOLN: 2.0000 g | Freq: Once | INTRAVENOUS | Status: DC

## 2016-09-30 MED ORDER — NOREPINEPHRINE BITARTRATE 1 MG/ML IV SOLN
0.0000 ug/min | INTRAVENOUS | Status: DC
  Administered 2016-09-30: 12 ug/min via INTRAVENOUS
  Filled 2016-09-30: qty 4

## 2016-09-30 MED ORDER — ORAL CARE MOUTH RINSE: 15.0000 mL | Freq: Four times a day (QID) | OROMUCOSAL | Status: DC

## 2016-09-30 MED ORDER — FENTANYL CITRATE (PF) 100 MCG/2ML IJ SOLN
100.0000 ug | INTRAMUSCULAR | Status: DC | PRN
  Administered 2016-10-01: 100 ug via INTRAVENOUS
  Filled 2016-09-30 (×2): qty 2

## 2016-09-30 MED ORDER — VANCOMYCIN HCL IN DEXTROSE 1-5 GM/200ML-% IV SOLN: 1000.0000 mg | INTRAVENOUS | Status: DC

## 2016-09-30 MED ORDER — PIPERACILLIN-TAZOBACTAM 3.375 G IVPB
3.3750 g | Freq: Three times a day (TID) | INTRAVENOUS | Status: DC
  Administered 2016-09-30 – 2016-10-01 (×2): 3.375 g via INTRAVENOUS
  Filled 2016-09-30 (×3): qty 50

## 2016-09-30 MED ORDER — INSULIN ASPART 100 UNIT/ML ~~LOC~~ SOLN
0.0000 [IU] | SUBCUTANEOUS | Status: DC
  Administered 2016-10-01: 2 [IU] via SUBCUTANEOUS

## 2016-09-30 MED ORDER — CHLORHEXIDINE GLUCONATE 0.12% ORAL RINSE (MEDLINE KIT): 15.0000 mL | Freq: Two times a day (BID) | OROMUCOSAL | Status: DC

## 2016-09-30 MED ORDER — ALUM & MAG HYDROXIDE-SIMETH 200-200-20 MG/5ML PO SUSP
30.0000 mL | Freq: Four times a day (QID) | ORAL | Status: DC | PRN
  Administered 2016-09-30: 30 mL via ORAL
  Filled 2016-09-30 (×2): qty 30

## 2016-09-30 MED ORDER — VANCOMYCIN HCL 10 G IV SOLR
1500.0000 mg | Freq: Once | INTRAVENOUS | Status: AC
  Administered 2016-09-30: 1500 mg via INTRAVENOUS
  Filled 2016-09-30: qty 1500

## 2016-09-30 MED ORDER — EPINEPHRINE PF 1 MG/10ML IJ SOSY
PREFILLED_SYRINGE | INTRAMUSCULAR | Status: AC
  Filled 2016-09-30: qty 30

## 2016-09-30 MED ORDER — POTASSIUM CHLORIDE 20 MEQ/15ML (10%) PO SOLN
40.0000 meq | Freq: Two times a day (BID) | ORAL | Status: DC
  Administered 2016-10-01: 40 meq via ORAL
  Filled 2016-09-30: qty 30

## 2016-09-30 NOTE — Code Documentation (Signed)
  Patient Name: Abbott PaoDillard M Bour   MRN: [id number]   Date of Birth/ Sex: 05/24/1961 , male      Admission Date: 09/11/2016  Attending Provider: Dorothea OgleIskra M Myers, MD  Primary Diagnosis: Sepsis [facility](HCC)   Indication: Pt was in his usual state of health until this PM, when he was noted to be in PEA. Code blue was subsequently called. At the time of arrival on scene, ACLS protocol was underway.   Technical Description:  - CPR performance duration:  18 minutes  - Was defibrillation or cardioversion used? No   - Was external pacer placed? No  - Was patient intubated pre/post CPR? Post    Medications Administered: Y = Yes; Blank = No Amiodarone    Atropine    Calcium    Epinephrine  Y  Lidocaine    Magnesium    Norepinephrine    Phenylephrine    Sodium bicarbonate    Vasopressin     Post CPR evaluation:  - Final Status - Was patient successfully resuscitated ? Yes - What is current rhythm? Sinus Tach  - What is current hemodynamic status? Stable   Miscellaneous Information:  - Labs sent, including: CBC, BMP, Mag, Phos  - Primary team notified?  Yes  - Family Notified? Yes  - Additional notes/ transfer status:  Transferred to 2M01.      Reymundo Pollarolyn Naveyah Iacovelli, MD  09/30/2016, 10:30 PM
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
## 2016-09-30 NOTE — Progress Notes (Signed)
PROGRESS NOTE    Michael Patel  ZOX:[id number] DOB: [birth year] DOA: 10/11/2016  PCP: Ignatius Specking, MD   Brief Narrative:  55/M with h/o seizure d/o, ETOH use, Prior CVA, L sided weakness admitted with Metabolic encephalopathy, CT abd with Mild colitis, some diarrhea inpatient  Assessment & Plan: Sepsis  - due to ? Colitis, low grade fevers on admission with metabolic encephalopathy, CT with colon wall thickening. - pt initially improving but worse in the past 24 hours - pt still with low grade fevers up to 99.2 F and WBC up from 7 --> 12 - stopped Levaquin and Flagyl and started on Zosyn, will monitor clinical response  - GI stool panel negative  - sepsis protocol re initiated and pt started on IVF - UA/CXR/FLu negative  ? Ileus vs SBO - surgery consulted   Dyspnea 1/21 - CT chest with no signs of aspiration but highly concerning  - ABX changed to Zosyn for now               Metabolic encephalopathy - due to above, improving rather slowly  - ammonia normal, CT with old CVA and diffuse atrophy - SLP eval done dys I diet recommended  - PT eval done, SNF recommended, SW consulted for assistance  - pt alert but more sick this AM   Alcoholic liver disease, pancytopenia  - Has low albumin, platelets, history of alcohol use. Ct with hepatic steatosis - continue thiamine, now drinks small amounts daily only, no withdrawal noted - monitor CIWA - blood counts improving   Seizure disorder - Continue Keppra, gabapentin  Hypertension, essential  - Continue metoprolol and aspirin  Acute kidney injury - from lasix and poor oral intake - stopped lasix, placed on IVF - close monitoring   COPD - stable, Continue Dulera/Albuterol PRN   Depression - Continue Celexa  Hypokalemia and hypomagnesemia, hypophosphatemia  - all three electrolytes low, continue to supplement and recheck in AM  Acute functional quadriplegia - from acute on chronic illness - will need PT and SNF  upon discharge    DVT prophylaxis: Lovenox SQ Code Status: FULL  Family Communication: no family at bedside this AM, called wife on her cell phone and left message to call me back on my cell phone  Disposition Plan: Home in 2-3 days when pt eating better and more alert, ileus resolved   Antimicrobials  Levaquin/FLagyl 1/17>1/22  Zosyn 1/22 -->   Subjective: More awake, more abd pain and nausea   Objective: Vitals:   09/30/16 0600 09/30/16 0837 09/30/16 1235 09/30/16 1641  BP: 118/85 (!) 122/91 99/78 (!) 103/52  Pulse: (!) 111 (!) 113 (!) 116 (!) 118  Resp: (!) 25 (!) 26 (!) 26 (!) 33  Temp:  98.8 F (37.1 C) 97.9 F (36.6 C) 99.2 F (37.3 C)  TempSrc:  Oral Oral Oral  SpO2: 93% 100% 96% 99%  Weight:      Height:        Intake/Output Summary (Last 24 hours) at 09/30/16 1736 Last data filed at 09/30/16 1500  Gross per 24 hour  Intake          1531.25 ml  Output              350 ml  Net          1181.25 ml   Filed Weights   09/25/16 0033 09/25/16 0700 09/25/16 1602  Weight: 89.4 kg (197 lb) 79.9 kg (176 lb 1.6 oz) 79.3 kg (174  lb 13.2 oz)    Examination:  General exam: Alert, awake today, able to converse, oriented to self and place  Respiratory system: Clear to auscultation. Respiratory effort normal. Cardiovascular system: S1 & S2 heard, tachycardic  Gastrointestinal system: Abdomen is distended, and slightly tender in epigastric area Central nervous system: more lucid today No focal neurological deficits.  Data Reviewed: I have personally reviewed following labs and imaging studies  CBC:  Recent Labs Lab 09/25/16 0013 09/26/16 0247 09/27/16 0223 09/28/16 0456 09/29/16 0218 09/30/16 0314  WBC 5.8 3.5* 3.5* 5.8 7.4 12.6*  NEUTROABS 4.9  --   --   --   --   --   HGB 12.3* 9.6* 9.5* 9.9* 10.3* 11.2*  HCT 36.6* 28.7* 29.3* 30.6* 31.8* 34.1*  MCV 102.8* 103.6* 104.3* 104.4* 105.0* 104.9*  PLT 125* 99* 90* 112* 136* 214   Basic Metabolic  Panel:  Recent Labs Lab 09/25/16 0529 09/26/16 0247 09/27/16 0223 09/28/16 0456 09/29/16 0218 09/30/16 0314  NA  --  144 143 145 142 145  K  --  3.4* 3.1* 3.1* 3.1* 3.2*  CL  --  107 109 111 106 104  CO2  --  28 25 23 27 26   GLUCOSE  --  118* 132* 139* 154* 156*  BUN  --  23* 15 15 19  34*  CREATININE  --  0.76 0.66 0.62 0.67 1.73*  CALCIUM  --  8.4* 8.4* 8.5* 8.4* 8.7*  MG 0.9*  --   --  1.2* 1.6*  --   PHOS  --   --   --  1.4* 3.7  --    Liver Function Tests:  Recent Labs Lab 09/25/16 0013 09/26/16 0247 09/27/16 0223 09/28/16 0456  AST 29 58* 59* 39  ALT 15* 20 24 21   ALKPHOS 50 53 63 64  BILITOT 2.3* 1.8* 1.6* 1.4*  PROT 7.9 6.3* 6.2* 6.3*  ALBUMIN 3.0* 2.4* 2.4* 2.4*    Recent Labs Lab 09/25/16 0013  LIPASE 28    Recent Labs Lab 09/25/16 0015 09/25/16 1145  AMMONIA 36* 33   Coagulation Profile:  Recent Labs Lab 09/25/16 0529  INR 1.12   CBG:  Recent Labs Lab 09/29/16 1718 09/29/16 2104 09/30/16 0835 09/30/16 1231 09/30/16 1644  GLUCAP 161* 166* 156* 137* 138*   Urine analysis:    Component Value Date/Time   COLORURINE AMBER (A) 09/25/2016 0058   APPEARANCEUR HAZY (A) 09/25/2016 0058   LABSPEC 1.027 09/25/2016 0058   PHURINE 5.0 09/25/2016 0058   GLUCOSEU NEGATIVE 09/25/2016 0058   HGBUR NEGATIVE 09/25/2016 0058   BILIRUBINUR SMALL (A) 09/25/2016 0058   KETONESUR 5 (A) 09/25/2016 0058   PROTEINUR 30 (A) 09/25/2016 0058   NITRITE NEGATIVE 09/25/2016 0058   LEUKOCYTESUR NEGATIVE 09/25/2016 0058   Recent Results (from the past 240 hour(s))  Blood culture (routine x 2)     Status: None   Collection Time: 09/25/16 12:27 AM  Result Value Ref Range Status   Specimen Description BLOOD RIGHT HAND  Final   Special Requests IN PEDIATRIC BOTTLE  1CC  Final   Culture NO GROWTH 5 DAYS  Final   Report Status 09/30/2016 FINAL  Final  Blood culture (routine x 2)     Status: None   Collection Time: 09/25/16  1:21 AM  Result Value Ref Range  Status   Specimen Description BLOOD LEFT ARM  Final   Special Requests BOTTLES DRAWN AEROBIC AND ANAEROBIC  9CC  Final   Culture NO GROWTH  5 DAYS  Final   Report Status 09/30/2016 FINAL  Final  Respiratory Panel by PCR     Status: None   Collection Time: 09/25/16  3:41 PM  Result Value Ref Range Status   Adenovirus NOT DETECTED NOT DETECTED Final   Coronavirus 229E NOT DETECTED NOT DETECTED Final   Coronavirus HKU1 NOT DETECTED NOT DETECTED Final   Coronavirus NL63 NOT DETECTED NOT DETECTED Final   Coronavirus OC43 NOT DETECTED NOT DETECTED Final   Metapneumovirus NOT DETECTED NOT DETECTED Final   Rhinovirus / Enterovirus NOT DETECTED NOT DETECTED Final   Influenza A NOT DETECTED NOT DETECTED Final   Influenza B NOT DETECTED NOT DETECTED Final   Parainfluenza Virus 1 NOT DETECTED NOT DETECTED Final   Parainfluenza Virus 2 NOT DETECTED NOT DETECTED Final   Parainfluenza Virus 3 NOT DETECTED NOT DETECTED Final   Parainfluenza Virus 4 NOT DETECTED NOT DETECTED Final   Respiratory Syncytial Virus NOT DETECTED NOT DETECTED Final   Bordetella pertussis NOT DETECTED NOT DETECTED Final   Chlamydophila pneumoniae NOT DETECTED NOT DETECTED Final   Mycoplasma pneumoniae NOT DETECTED NOT DETECTED Final  MRSA PCR Screening     Status: None   Collection Time: 09/25/16  4:00 PM  Result Value Ref Range Status   MRSA by PCR NEGATIVE NEGATIVE Final    Comment:        The GeneXpert MRSA Assay (FDA approved for NASAL specimens only), is one component of a comprehensive MRSA colonization surveillance program. It is not intended to diagnose MRSA infection nor to guide or monitor treatment for MRSA infections.   Gastrointestinal Panel by PCR , Stool     Status: None   Collection Time: 09/27/16  8:30 AM  Result Value Ref Range Status   Campylobacter species NOT DETECTED NOT DETECTED Final   Plesimonas shigelloides NOT DETECTED NOT DETECTED Final   Salmonella species NOT DETECTED NOT DETECTED  Final   Yersinia enterocolitica NOT DETECTED NOT DETECTED Final   Vibrio species NOT DETECTED NOT DETECTED Final   Vibrio cholerae NOT DETECTED NOT DETECTED Final   Enteroaggregative E coli (EAEC) NOT DETECTED NOT DETECTED Final   Enteropathogenic E coli (EPEC) NOT DETECTED NOT DETECTED Final   Enterotoxigenic E coli (ETEC) NOT DETECTED NOT DETECTED Final   Shiga like toxin producing E coli (STEC) NOT DETECTED NOT DETECTED Final   Shigella/Enteroinvasive E coli (EIEC) NOT DETECTED NOT DETECTED Final   Cryptosporidium NOT DETECTED NOT DETECTED Final   Cyclospora cayetanensis NOT DETECTED NOT DETECTED Final   Entamoeba histolytica NOT DETECTED NOT DETECTED Final   Giardia lamblia NOT DETECTED NOT DETECTED Final   Adenovirus F40/41 NOT DETECTED NOT DETECTED Final   Astrovirus NOT DETECTED NOT DETECTED Final   Norovirus GI/GII NOT DETECTED NOT DETECTED Final   Rotavirus A NOT DETECTED NOT DETECTED Final   Sapovirus (I, II, IV, and V) NOT DETECTED NOT DETECTED Final    Radiology Studies:  Scheduled Meds: . aspirin  81 mg Oral Daily  . bisacodyl  10 mg Rectal Daily  . folic acid  1 mg Intravenous Daily  . gabapentin  100 mg Oral QHS  . insulin aspart  0-9 Units Subcutaneous TID WC  . levETIRAcetam  500 mg Intravenous Q12H  . metoprolol tartrate  25 mg Oral BID  . mometasone-formoterol  2 puff Inhalation BID  . pantoprazole sodium  40 mg Oral Daily  . piperacillin-tazobactam (ZOSYN)  IV  3.375 g Intravenous Q8H  . thiamine injection  100 mg  Intravenous Daily   Continuous Infusions: . sodium chloride 75 mL/hr at 09/30/16 1159    LOS: 5 days   Time spent:  Debbora Presto, MD  [facility] Pager [phone]  If 7PM-7AM, please contact night-coverage [email or web address] Password [email or web address]  If 7PM-7AM, please contact night-coverage [email or web address] Password [facility] 09/30/2016, 5:36 PM

## 2016-09-30 NOTE — Progress Notes (Addendum)
1318 - Critical lactic acid 2.3 paged to Dr. Izola PriceMyers.  1624 - Critical lactic acid 2.5 paged to Dr. Izola PriceMyers.  Message received, pt already under sepsis protocol, ABX changed to Zosyn. Monitor for now, keep on IVF.  Debbora PrestoMAGICK-Michah Minton, MD  [facility] Pager [phone]  If 7PM-7AM, please contact night-coverage [email or web address] Password [email or web address]
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
## 2016-09-30 NOTE — Progress Notes (Signed)
Pharmacy Antibiotic Note  Michael Patel is a 56 y.o. male admitted on April 13, 2017 with confusion and sepsis originally thought to be from PNA however now concerned for intra-abdominal source.  Pharmacy has been consulted to transition to Zosyn for empiric coverage.   Noted the patient was on Levaquin/Flagyl previously and Flagyl has yet to be discontinued. Will f/u with the MD on stopping this since Zosyn provides anaerobic coverage. AKI noted - SCr 1.73 << 0.67, CrCl hard to estimate but will assume >20 ml/min for now.   Plan: 1. Start Zosyn 3.375g IV every 8 hours (infused over 4 hours) 2. .Will continue to follow renal function, culture results, LOT, and antibiotic de-escalation plans   Height: 6\' 3"  (190.5 cm) Weight: 174 lb 13.2 oz (79.3 kg) IBW/kg (Calculated) : 84.5  Temp (24hrs), Avg:97.8 F (36.6 C), Min:97 F (36.1 C), Max:98.8 F (37.1 C)   Recent Labs Lab 09/25/16 0331 09/25/16 0529 09/25/16 0845 09/25/16 1337 09/25/16 1558 09/26/16 0247 09/27/16 0223 09/28/16 0456 09/29/16 0218 09/30/16 0314  WBC  --   --   --   --   --  3.5* 3.5* 5.8 7.4 12.6*  CREATININE  --   --   --   --   --  0.76 0.66 0.62 0.67 1.73*  LATICACIDVEN 1.93* 1.4 2.2* 1.9 1.7  --   --   --   --   --     Estimated Creatinine Clearance: 52.8 mL/min (by C-G formula based on SCr of 1.73 mg/dL (H)).    Allergies  Allergen Reactions  . Morphine And Related Other (See Comments)    unknown  . Oxycodone Itching and Rash    Antimicrobials this admission: Azithro 1/17 x 1 CTX 1/17 x 1 Flagyl 1/18 >> Levaquin 1/18 >> 1/22 Zosyn 1/22 >>  Dose adjustments this admission: n/a  Microbiology results: 1/17 BCx >> ngtd 1/17 MRSA PCR >> neg 1/19 GI panel >> neg  Thank you for allowing pharmacy to be a part of this patient's care.  Georgina PillionElizabeth Cesiah Westley, PharmD, BCPS Clinical Pharmacist If after 3:30p, please call main pharmacy at: [phone] 09/30/2016 12:00 PM
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
## 2016-09-30 NOTE — Consult Note (Signed)
[facility] Surgery Consult Note  Michael Patel 10-19-1960  [id number].    Requesting MD: Doyle Askew, MD Chief Complaint/Reason for Consult: SBO  HPI:  Michael Patel is a 56 year-old male with a medical history of HTN, COPD, seizure disorder, depression, and alcohol abuse who presented to [facility] via EMS with encephalopathy. ED workup significant for temp 100.2, tachycardia, tachypnea, t.bili 2.3, ammonia 36, lactate 2.56. CT head negative for acute abnormality and CT abd pelvis significant for colonic wall thickening from splenic flexure through sigmoid colon without evidence of obstruction. Hepatic steatosis also appreciated on CT.  Patient was admitted and started on empiric abx with working diagnosis of sepsis of unknown etiology - suspect 2/2 colitis. Blood cultures, UA, and GI panel negative. Abdominal film today singificant for 4.6 cm dilated loops of small bowel and general surgery was asked to see regarding possible bowel obstruction.    Patient reports abdominal discomfort today. Per wife, at bedside, the patients last BM was the day he presented to the ED. Denies mucous or blood in stools. He denies nausea, vomiting, diarrhea today. Does report vomiting 2-3 days ago. Denies a history of SBO. Denies a history of abdominal surgeries, diverticulitis, or hernias. Patients wife is his primary care-taker at home as he has been "weak" since left hip and leg surgeries in 2016. At baseline he is wheelchair bound.  Patient smokes 1 pack daily cigarettes. Drinks approximately 1 mixed drink every 2 weeks and reports he has not abused alcohol in over 7 months. Denies illicit drug use.  ROS: Review of Systems  Constitutional: Negative for chills and fever.  HENT: Negative for congestion.   Respiratory: Negative for hemoptysis, sputum production and shortness of breath.   Cardiovascular: Negative for chest pain, palpitations and leg swelling.  Gastrointestinal: Positive for abdominal pain, constipation  and vomiting. Negative for blood in stool, diarrhea, melena and nausea.   Family History  Problem Relation Age of Onset  . Diabetes type II Mother   . Breast cancer Mother   . Lung cancer Mother   . Diabetes Mellitus II Sister   . Cancer Sister   . Other Father     brain tumor  . Brain cancer Father   . Diabetes Mellitus II Maternal Grandmother     Past Medical History:  Diagnosis Date  . 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 Seizures ([place])    01/26/15  . Type 2 diabetes mellitus ([place])    controlled    Past Surgical History:  Procedure Laterality Date  . left humorous fracture    . left total hip replacement      Social History:  reports that he has been smoking Cigarettes.  He has a 41.00 pack-year smoking history. He has never used smokeless tobacco. He reports that he drinks about 8.4 - 12.6 oz of alcohol per week . He reports that he does not use drugs.  Allergies:  Allergies  Allergen Reactions  . Morphine And Related Other (See Comments)    unknown  . Oxycodone Itching and Rash    Medications Prior to Admission  Medication Sig Dispense Refill  . aspirin EC 81 MG tablet Take 81 mg by mouth daily.    . citalopram (CELEXA) 20 MG tablet Take 20 mg by mouth at bedtime.    Marland Kitchen Dexlansoprazole (DEXILANT) 30 MG capsule Take 30 mg  by mouth daily.    Marland Kitchen gabapentin (NEURONTIN) 100 MG capsule Take 100 mg by mouth 2 (two) times daily.    Marland Kitchen ibuprofen (ADVIL,MOTRIN) 200 MG tablet Take 400 mg by mouth 2 (two) times daily.    Marland Kitchen levETIRAcetam (KEPPRA) 500 MG tablet Take 1 tablet (500 mg total) by mouth 2 (two) times daily. 180 tablet 4  . LORazepam (ATIVAN) 0.5 MG tablet Take 0.5 mg by mouth 2 (two) times daily as needed for anxiety.    . metoprolol succinate (TOPROL-XL) 50 MG 24 hr tablet Take 50 mg by mouth daily. Take with or immediately following a meal.    .  mometasone-formoterol (DULERA) 200-5 MCG/ACT AERO Inhale 2 puffs into the lungs 2 (two) times daily.      Blood pressure (!) 122/91, pulse (!) 113, temperature 98.8 F (37.1 C), temperature source Oral, resp. rate (!) 26, height 6' 3"  (1.905 m), weight 79.3 kg (174 lb 13.2 oz), SpO2 100 %. Physical Exam: General: cooperative, listless, white male who is laying in bed in NAD HEENT: head is normocephalic, atraumatic.  Sclera are noninjected.  Heart: tachycardic, regular rhythm.  No obvious murmurs, gallops, or rubs noted.  Palpable pedal pulses bilaterally Lungs: CTAB, no wheezes, rhonchi, or rales noted.  Respiratory effort nonlabored Abd: firm, non-tender, moderately distended, tympanic, hypoactive BS, no masses or hernias MS: all 4 extremities are symmetrical with no cyanosis, clubbing, or edema. Lower leg strength equal and in tact bilaterally w/ plantar and dorsiflexion Skin: warm and dry   Psych: flattened affect Neuro:  oriented to person, place, not time (2009),  extremity CSM intact bilaterally, normal speech   Results for orders placed or performed during the hospital encounter of 09/25/2016 (from the past 48 hour(s))  Glucose, capillary     Status: Abnormal   Collection Time: 09/28/16  4:41 PM  Result Value Ref Range   Glucose-Capillary 185 (H) 65 - 99 mg/dL  Glucose, capillary     Status: Abnormal   Collection Time: 09/28/16  9:01 PM  Result Value Ref Range   Glucose-Capillary 155 (H) 65 - 99 mg/dL  CBC     Status: Abnormal   Collection Time: 09/29/16  2:18 AM  Result Value Ref Range   WBC 7.4 4.0 - 10.5 K/uL   RBC 3.03 (L) 4.22 - 5.81 MIL/uL   Hemoglobin 10.3 (L) 13.0 - 17.0 g/dL   HCT 31.8 (L) 39.0 - 52.0 %   MCV 105.0 (H) 78.0 - 100.0 fL   MCH 34.0 26.0 - 34.0 pg   MCHC 32.4 30.0 - 36.0 g/dL   RDW 15.8 (H) 11.5 - 15.5 %   Platelets 136 (L) 150 - 400 K/uL  Basic metabolic panel     Status: Abnormal   Collection Time: 09/29/16  2:18 AM  Result Value Ref Range    Sodium 142 135 - 145 mmol/L   Potassium 3.1 (L) 3.5 - 5.1 mmol/L   Chloride 106 101 - 111 mmol/L   CO2 27 22 - 32 mmol/L   Glucose, Bld 154 (H) 65 - 99 mg/dL   BUN 19 6 - 20 mg/dL   Creatinine, Ser 0.67 0.61 - 1.24 mg/dL   Calcium 8.4 (L) 8.9 - 10.3 mg/dL   GFR calc non Af Amer >60 >60 mL/min   GFR calc Af Amer >60 >60 mL/min    Comment: (NOTE) The eGFR has been calculated using the CKD EPI equation. This calculation has not been validated in all clinical situations. eGFR's persistently <  60 mL/min signify possible Chronic Kidney Disease.    Anion gap 9 5 - 15  Magnesium     Status: Abnormal   Collection Time: 09/29/16  2:18 AM  Result Value Ref Range   Magnesium 1.6 (L) 1.7 - 2.4 mg/dL  Phosphorus     Status: None   Collection Time: 09/29/16  2:18 AM  Result Value Ref Range   Phosphorus 3.7 2.5 - 4.6 mg/dL  Glucose, capillary     Status: Abnormal   Collection Time: 09/29/16  7:57 AM  Result Value Ref Range   Glucose-Capillary 163 (H) 65 - 99 mg/dL   Comment 1 Notify RN    Comment 2 Document in Chart   Glucose, capillary     Status: Abnormal   Collection Time: 09/29/16 11:28 AM  Result Value Ref Range   Glucose-Capillary 154 (H) 65 - 99 mg/dL   Comment 1 Notify RN    Comment 2 Document in Chart   Glucose, capillary     Status: Abnormal   Collection Time: 09/29/16  5:18 PM  Result Value Ref Range   Glucose-Capillary 161 (H) 65 - 99 mg/dL   Comment 1 Notify RN    Comment 2 Document in Chart   Glucose, capillary     Status: Abnormal   Collection Time: 09/29/16  9:04 PM  Result Value Ref Range   Glucose-Capillary 166 (H) 65 - 99 mg/dL  CBC     Status: Abnormal   Collection Time: 09/30/16  3:14 AM  Result Value Ref Range   WBC 12.6 (H) 4.0 - 10.5 K/uL   RBC 3.25 (L) 4.22 - 5.81 MIL/uL   Hemoglobin 11.2 (L) 13.0 - 17.0 g/dL   HCT 34.1 (L) 39.0 - 52.0 %   MCV 104.9 (H) 78.0 - 100.0 fL   MCH 34.5 (H) 26.0 - 34.0 pg   MCHC 32.8 30.0 - 36.0 g/dL   RDW 16.0 (H) 11.5 -  15.5 %   Platelets 214 150 - 400 K/uL  Basic metabolic panel     Status: Abnormal   Collection Time: 09/30/16  3:14 AM  Result Value Ref Range   Sodium 145 135 - 145 mmol/L   Potassium 3.2 (L) 3.5 - 5.1 mmol/L   Chloride 104 101 - 111 mmol/L   CO2 26 22 - 32 mmol/L   Glucose, Bld 156 (H) 65 - 99 mg/dL   BUN 34 (H) 6 - 20 mg/dL   Creatinine, Ser 1.73 (H) 0.61 - 1.24 mg/dL    Comment: DELTA CHECK NOTED   Calcium 8.7 (L) 8.9 - 10.3 mg/dL   GFR calc non Af Amer 42 (L) >60 mL/min   GFR calc Af Amer 49 (L) >60 mL/min    Comment: (NOTE) The eGFR has been calculated using the CKD EPI equation. This calculation has not been validated in all clinical situations. eGFR's persistently <60 mL/min signify possible Chronic Kidney Disease.    Anion gap 15 5 - 15  Glucose, capillary     Status: Abnormal   Collection Time: 09/30/16  8:35 AM  Result Value Ref Range   Glucose-Capillary 156 (H) 65 - 99 mg/dL   Comment 1 Notify RN    Comment 2 Document in Chart    Ct Chest Wo Contrast  Result Date: 09/29/2016 CLINICAL DATA:  Aspiration, vomiting, dyspnea EXAM: CT CHEST WITHOUT CONTRAST TECHNIQUE: Multidetector CT imaging of the chest was performed following the standard protocol without IV contrast. COMPARISON:  CT chest 01/26/2015 FINDINGS: Cardiovascular: No  significant vascular findings. Normal heart size. No pericardial effusion. Thoracic aortic atherosclerosis. No thoracic aortic aneurysm. Coronary artery atherosclerosis in the left main, lad, circumflex and RCA. Mediastinum/Nodes: No enlarged mediastinal or axillary lymph nodes. Thyroid gland and trachea demonstrate no significant findings. Diffuse esophageal wall thickening as can be seen with esophagitis. Lungs/Pleura: Small right pleural effusion. No focal consolidation. No left pleural effusion. No pneumothorax. Mild bronchial wall thickening centrally which may reflect mild chronic bronchitis. Upper Abdomen: Small volume perihepatic ascites.  Partially visualized is a distended stomach with an air-fluid level. Small air-fluid level seen in the proximal jejunum, but this is only partially visualized. Musculoskeletal: No lytic or sclerotic osseous lesion. No acute osseous abnormality. Soft tissue: Bilateral retroareolar fibroglandular soft tissue as can be seen with gynecomastia. IMPRESSION: 1. Small right pleural effusion. 2. Otherwise, no active cardiopulmonary disease. 3. Coronary artery atherosclerosis. 4.  Aortic Atherosclerosis (ICD10-170.0) 5. Diffuse esophageal wall thickening as can be seen with esophagitis. 6. Partially visualized is a distended stomach with an air-fluid level. Small air-fluid level seen in the proximal jejunum, but this is only partially visualized. If there is clinical concern regarding obstruction or ileus, recommend an upright and supine abdomen x-ray. Electronically Signed   By: Kathreen Devoid   On: 09/29/2016 16:21   Dg Abd 2 Views  Result Date: 09/30/2016 CLINICAL DATA:  Small-bowel obstruction. EXAM: ABDOMEN - 2 VIEW COMPARISON:  CT 09/25/2016. FINDINGS: Oral contrast from prior CT noted throughout the colon. Prominently distended loops of small bowel measuring up to 4.6 cm in diameter are noted. Findings suggest partial small bowel obstruction. Adynamic ileus could also present in this fashion. No free air. Aortoiliac atherosclerotic vascular calcification. Degenerative changes lumbar spine and right hip. Left hip replacement. IMPRESSION: Oral contrast from prior CT noted throughout the colon. Prominently distended loops of small bowel measuring up to 4.6 cm in diameter noted. Findings suggest partial small bowel obstruction. Adynamic ileus could also present this fashion. Follow-up exam to demonstrate clearing suggested. No free air. Electronically Signed   By: Marcello Moores  Register   On: 09/30/2016 06:52   Assessment/Plan Ileus vs SBO - suspect ileus; no PMH abdominal surgeries, hernias, or intra-abdominal masses -  no emesis in > 24h - ok for patient to have liquids. Recurrent emesis may warrant NGT placement, hold off for now. - dulcolax suppositories  - PT eval    Jill Alexanders, [facility] Surgery 09/30/2016, 12:08 PM Pager: [phone] Consults: [phone] Mon-Fri 7:00 am-4:30 pm Sat-Sun 7:00 am-11:30 am

## 2016-09-30 NOTE — Procedures (Signed)
Intubation Procedure Note Michael Patel [id number] 01/03/1961  Procedure: Intubation Indications: Respiratory insufficiency  Procedure Details Consent: Unable to obtain consent because of emergent medical necessity. Time Out: Verified patient identification, verified procedure, site/side was marked, verified correct patient position, special equipment/implants available, medications/allergies/relevent history reviewed, required imaging and test results available.  Performed  Maximum sterile technique was used including gloves.  MAC and 3    Evaluation Hemodynamic Status: Transient hypotension treated with pressers and CPR.; O2 sats: currently acceptable Patient's Current Condition: unstable Complications: No apparent complications Patient did tolerate procedure well. Chest X-ray ordered to verify placement.  CXR: pending.   Dulcy Fanny 09/30/2016

## 2016-09-30 NOTE — H&P (Signed)
Name: Michael Patel MRN: [id number] DOB: 05-12-1961    LOS: 5  PCCM ADMISSION NOTE  History of Present Illness: Patient is a 56 yo M with pmhx significant for seizures on Keppra and alcohol use disorder who was admitted on 09/26/2015 with AMS. History was provided entirely from chart review. Wife reported on admission that he was in his usual state of health until three days prior to presentation. He developed progressive weakness and became less responsive to questions. He had one large episode of emesis over this time period but wife denied any fevers, changes in his chronic cough, hemoptysis, focal weakness, speech changes, or focal complaints. She called 911 twice but he refused transportation until her third call on the 17th when he was too altered to refuse. EMS found his SpO2 in the upper 80s and brought him in for evaluation. On arrival to the ED, he was febrile with a lactic acid of 2.56 and started on empiric antibiotics. CT of his colon subsequently revealed colitis. CT Chest was negative for pulmonary disease but revealed partially visualized air-fluid levels in the proximal jejunum . Follow up abdominal films were concerning for partial SBO vs adynamic ilieus. Surgery was consulted. Findings were felt to be consistent with ileus and diet was ultimately advanced to liquids. Patient initially improved, but mental status began to decline again on the 22nd. Patient developed respiratory distress that evening. Before a breathing treatment could be administered patient went into cardiac arrest. Code blue was called. Patient was resuscitated and ROSC was achieved. Transferred to 72M for admission.   Lines / Drains: ETT 1/22   Cultures: 1/17 Blood >> No growth (final)  1/17 RVP >> Negative  1/17 GI PCR panel >> Negative    Antibiotics:  Azithromycin 1/17 x 1 dose  Ceftriaxone 1/17 x 1 dose  Flagyl 1/17 >> 1/22 Levaquin 1/18 >> 1/22 Zosyn 1/22 >> Vanc 1/22 >>   Tests / Events: CXR 1/17  >> bibasilar ATX, ? Small LLL infiltrate  CT Head 1/17 >> Chronic small vessel dz, remote right parietal infarct, Left maxillary sinus disease (?atypical/fungal infection) CT Abdomen Pelvis 1/17 >> Mild colitis, hepatic steatosis  CXR 1/17 >> No active cardiopulmonary dz  Modified Barium Swallow Study 1/18 >> Moderate pharyngeal dysphagia, and mild oral dysphagia (moderate aspiration risk) recommend dysphagia 1 diet  CT Chest 1/21 >> Small R pleural effusion, diffuse esophageal wall thickening consistent with esophagitis, partially visualized air-fluid level within the proximal jejunum  Abdominal 2 View 1/21 >> Partial SBO vs. Adynamic ileus  Renal US 1/22 >> No evidence of hydro   The patient is sedated, intubated and unable to provide history, which was obtained for available medical records.    Past Medical History:  Diagnosis Date  . 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 Seizures ([facility])    01/26/15  . Type 2 diabetes mellitus ([facility])    controlled   Past Surgical History:  Procedure Laterality Date  . left humorous fracture    . left total hip replacement     Prior to Admission medications   Medication Sig Start Date End Date Taking? Authorizing Provider  aspirin EC 81 MG tablet Take 81 mg by mouth daily.   Yes Historical Provider, MD  citalopram (CELEXA) 20 MG tablet Take 20 mg by mouth at bedtime.   Yes Historical Provider, MD  Dexlansoprazole (DEXILANT) 30 MG capsule Take 30 mg by mouth daily.   Yes Historical Provider, MD  gabapentin (NEURONTIN) 100 MG capsule Take 100 mg by mouth 2 (two) times daily.   Yes Historical Provider, MD  ibuprofen (ADVIL,MOTRIN) 200 MG tablet Take 400 mg by mouth 2 (two) times daily.   Yes Historical Provider, MD  levETIRAcetam (KEPPRA) 500 MG tablet Take 1 tablet (500 mg total) by mouth 2 (two) times daily. 05/10/15  Yes Michael Marker, MD  LORazepam (ATIVAN) 0.5 MG tablet Take 0.5 mg by mouth 2 (two) times daily as needed for anxiety.   Yes Historical Provider, MD  metoprolol succinate (TOPROL-XL) 50 MG 24 hr tablet Take 50 mg by mouth daily. Take with or immediately following a meal.   Yes Historical Provider, MD  mometasone-formoterol (DULERA) 200-5 MCG/ACT AERO Inhale 2 puffs into the lungs 2 (two) times daily.   Yes Historical Provider, MD   Allergies Allergies  Allergen Reactions  . Morphine And Related Other (See Comments)    unknown  . Oxycodone Itching and Rash    Family History Family History  Problem Relation Age of Onset  . Diabetes type II Mother   . Breast cancer Mother   . Lung cancer Mother   . Diabetes Mellitus II Sister   . Cancer Sister   . Other Father     brain tumor  . Brain cancer Father   . Diabetes Mellitus II Maternal Grandmother     Social History  reports that he has been smoking Cigarettes.  He has a 41.00 pack-year smoking history. He has never used smokeless tobacco. He reports that he drinks about 8.4 - 12.6 oz of alcohol per week . He reports that he does not use drugs.  Review Of Systems  11 points review of systems is negative with an exception of listed in HPI.  Vital Signs: Temp:  [97.7 F (36.5 C)-99.2 F (37.3 C)] 98.5 F (36.9 C) (01/22 2009) Pulse Rate:  [100-118] 111 (01/22 2249) Resp:  [24-38] 28 (01/22 2249) BP: (94-148)/(52-112) 144/99 (01/22 2249) SpO2:  [93 %-100 %] 96 % (01/22 1923) FiO2 (%):  [100 %] 100 % (01/22 2249) I/O last 3 completed shifts: In: 1831.3 [P.O.:340; I.V.:526.3; IV Piggyback:965] Out: 475 [Urine:475]  Physical Examination: General:  Obtunded, intubated  Neuro: Unresponsive on vent  HEENT:  ET tube in place Neck:  Trachea midline  Cardiovascular:  Tachycardic, regular rhythm, no m/r/g  Lungs:  CTAB, no wheezing, rales, or rhonchi  Abdomen:  Mildly distended, soft, no bowel sounds appreciated. OG tube in place with  bilious output.  Extremities: No edema, distal pulses intact    Skin:  No rashes or erythema   Ventilator settings: FiO2 (%):  [100 %] 100 %  Labs and Imaging:  Reviewed.  Please refer to the Assessment and Plan section for relevant results.  Assessment and Plan:  ASSESSMENT / PLAN:  PULMONARY  ASSESSMENT:  Acute Respiratory Failure, secondary to aspiration ? AMS, unable to protect airway Intubated s/p cardiac arrest  Hx COPD PLAN:   ABG CXR Full vent support  Albuterol q4 prn  Dulera BID Daily SBT as appropriate   CARDIOVASCULAR  ASSESSMENT:  S/p Cardiac arrest  Sinus tach  Hypotension - cardiac vs septic shock  Hx HTN Hx CAD PLAN:  Levophed  Goal MAP > 65 Hold metoprolol  ASA daily   RENAL  ASSESSMENT:   Oliguric AKI - renal ultrasound without hydronephrosis  Hypokalemic  PLAN:  Foley in place Follow I/Os Avoid nephrotoxic meds Bicarb drip at 125 in D5 Follow BMP  Replete lytes   GASTROINTESTINAL  ASSESSMENT:   Concern for SBO vs. Ileus  Colitis on CT Abd Esophagitis on CT Chest GI panel negative  Hx alcoholic liver disease (hepatic steatosis on CT)  PLAN:   Surgery previously consulted - will need to be called in AM  OG tube with almost 2L bilious output  NPO  Abx below  Follow abdominal films  Protonix BID Checking HIV   HEMATOLOGIC  ASSESSMENT:   Leukocytosis  Mild macrocytic anemia (unclear baseline)  Thrombocytopenic on admission - resolved  PLAN:  Follow CBC Sub q heparin for prophylaxis (started on ICU transfer)    INFECTIOUS  ASSESSMENT:   Sepsis - unclear source, likely GI  Now with aspiration ? PLAN:   Vanc 1/22 >>  Zosyn 1/22 >>  Levophed  GI PCR panel negative RVP negative Blood cultures negative   ENDOCRINE  ASSESSMENT:   Hyperglycemic  PLAN:   SSI q4 Follow CBGs  NEUROLOGIC  ASSESSMENT:   Metabolic encephalopathy  PLAN:   Abx above  Follow labs, electrolytes  Consider lactulose if mental  status does not improve  Fentanyl and versed PRN   Family: Attempted to call family at home and on cell phone without answer. RN was able to get in touch with Wife and informed her of his code. She is en route to the hospital. Will touch base on arrival.   The patient is critically ill with multiple organ systems failure and requires high complexity decision making for assessment and support, frequent evaluation and titration of therapies, application of advanced monitoring technologies and extensive interpretation of multiple databases. Critical Care Time devoted to patient care services described in this note is 45 minutes.  Reymundo Pollarolyn Damarri Rampy, M.D. Pager: [phone] 09/30/2016, 11:37 PM

## 2016-09-30 NOTE — Care Management Note (Signed)
Case Management Note  Patient Details  Name: Michael Patel MRN: [id number] Date of Birth: 1961/04/12  Subjective/Objective:    Prior CVA, presents with sepsis,ileus vs sbo, metabolic encephalopathy, liver disease, pancytopenia, aki, seizure d/o, conts on ivf's, keppra iv, iv abx, plan is for SNF, CSW referral.                 Action/Plan:   Expected Discharge Date:                  Expected Discharge Plan:  Skilled Nursing Facility  In-House Referral:  Clinical Social Work  Discharge planning Services  CM Consult, Medication Assistance, Indigent Health Clinic  Post Acute Care Choice:    Choice offered to:     DME Arranged:    DME Agency:     HH Arranged:    HH Agency:     Status of Service:  Completed, signed off  If discussed at [employer] Meetings, dates discussed:    Additional Comments:  Leone Havenaylor, Garold Sheeler [place], RN 09/30/2016, 9:12 PM

## 2016-10-01 ENCOUNTER — Inpatient Hospital Stay (HOSPITAL_COMMUNITY): Payer: Self-pay

## 2016-10-01 DIAGNOSIS — A419 Sepsis, unspecified organism: Secondary | ICD-10-CM | POA: Diagnosis present

## 2016-10-01 DIAGNOSIS — R6521 Severe sepsis with septic shock: Secondary | ICD-10-CM

## 2016-10-01 DIAGNOSIS — R4182 Altered mental status, unspecified: Secondary | ICD-10-CM | POA: Diagnosis present

## 2016-10-01 DIAGNOSIS — T17908A Unspecified foreign body in respiratory tract, part unspecified causing other injury, initial encounter: Secondary | ICD-10-CM | POA: Diagnosis present

## 2016-10-01 DIAGNOSIS — K760 Fatty (change of) liver, not elsewhere classified: Secondary | ICD-10-CM | POA: Diagnosis present

## 2016-10-01 DIAGNOSIS — R188 Other ascites: Secondary | ICD-10-CM | POA: Diagnosis present

## 2016-10-01 DIAGNOSIS — I469 Cardiac arrest, cause unspecified: Secondary | ICD-10-CM | POA: Diagnosis present

## 2016-10-01 LAB — GLUCOSE, CAPILLARY: GLUCOSE-CAPILLARY: 191 mg/dL — AB (ref 65–99)

## 2016-10-01 LAB — BASIC METABOLIC PANEL
Anion gap: 18 — ABNORMAL HIGH (ref 5–15)
BUN: 51 mg/dL — AB (ref 6–20)
CALCIUM: 7.8 mg/dL — AB (ref 8.9–10.3)
CO2: 24 mmol/L (ref 22–32)
CREATININE: 3.08 mg/dL — AB (ref 0.61–1.24)
Chloride: 102 mmol/L (ref 101–111)
GFR calc Af Amer: 24 mL/min — ABNORMAL LOW (ref >60)
GFR, EST NON AFRICAN AMERICAN: 21 mL/min — AB (ref >60)
Glucose, Bld: 247 mg/dL — ABNORMAL HIGH (ref 65–99)
Potassium: 3.6 mmol/L (ref 3.5–5.1)
Sodium: 144 mmol/L (ref 135–145)

## 2016-10-01 LAB — CBC
HEMATOCRIT: 36.8 % — AB (ref 39.0–52.0)
Hemoglobin: 11.7 g/dL — ABNORMAL LOW (ref 13.0–17.0)
MCH: 33.9 pg (ref 26.0–34.0)
MCHC: 31.8 g/dL (ref 30.0–36.0)
MCV: 106.7 fL — AB (ref 78.0–100.0)
Platelets: 253 10*3/uL (ref 150–400)
RBC: 3.45 MIL/uL — ABNORMAL LOW (ref 4.22–5.81)
RDW: 16.3 % — AB (ref 11.5–15.5)
WBC: 6.8 10*3/uL (ref 4.0–10.5)

## 2016-10-01 LAB — MRSA PCR SCREENING: MRSA BY PCR: NEGATIVE

## 2016-10-01 LAB — MAGNESIUM: Magnesium: 2.3 mg/dL (ref 1.7–2.4)

## 2016-10-01 LAB — HIV ANTIBODY (ROUTINE TESTING W REFLEX): HIV SCREEN 4TH GENERATION: NONREACTIVE

## 2016-10-01 MED ORDER — SODIUM BICARBONATE 8.4 % IV SOLN
INTRAVENOUS | Status: AC
  Filled 2016-10-01: qty 50

## 2016-10-01 MED ORDER — SODIUM CHLORIDE 0.9 % IV SOLN
1.0000 mg/h | INTRAVENOUS | Status: DC
  Filled 2016-10-01: qty 10

## 2016-10-01 MED ORDER — IOPAMIDOL (ISOVUE-300) INJECTION 61%
INTRAVENOUS | Status: AC
  Filled 2016-10-01: qty 50

## 2016-10-01 MED ORDER — ACETAMINOPHEN 325 MG PO TABS: 650.0000 mg | ORAL_TABLET | Freq: Four times a day (QID) | ORAL | Status: DC | PRN

## 2016-10-01 MED ORDER — ACETAMINOPHEN 650 MG RE SUPP: 650.0000 mg | Freq: Four times a day (QID) | RECTAL | Status: DC | PRN

## 2016-10-01 MED ORDER — SODIUM BICARBONATE 8.4 % IV SOLN
50.0000 meq | Freq: Once | INTRAVENOUS | Status: AC
  Administered 2016-10-01: 50 meq via INTRAVENOUS

## 2016-10-01 MED ORDER — VASOPRESSIN 20 UNIT/ML IV SOLN
0.0300 [IU]/min | INTRAVENOUS | Status: DC
  Filled 2016-10-01: qty 2

## 2016-10-01 MED ORDER — MORPHINE BOLUS VIA INFUSION
2.0000 mg | INTRAVENOUS | Status: DC | PRN
  Filled 2016-10-01: qty 4

## 2016-10-02 MED FILL — Medication: Qty: 1 | Status: AC

## 2016-10-10 NOTE — Progress Notes (Addendum)
Called back to patient's bedside given worsening hypotension despite max levophed and worsening respiratory status requiring higher levels of FiO2. Patient appeared mottled and tachypneic on exam. At 2220 this evening, patient suffered PEA arrest likely and achieved ROSC after 18 minutes. He was intubated and started on levophed. When I arrived for re-evaluation, patient's wife, cousin and family member were in the room. We discussed patient's earlier events of the day and likely cause of his code. We discussed his ongoing medical problems. I asked patient's wife if they had ever discussed wishes such as intubation, CPR, life support. Wife and family was very clear that mr. Tasia CatchingsCraig would never want to be intubated even for a short period of time. We discussed options including full aggressive care with ventilation, pressors, etc or transitioning to comfort care which would include extubation and stopping abx, pressors- all medications other than those to keep him comfortable. Patient's wife then reiterated that he would not want to be on a breathing machine even for a short period of time, understanding that he would likely pass away in a short period of time without these treatments. During this discussion, patient lost a pulse and ACLS was initiated.   One minute into the code, patient's wife asked us to stop chest compressions and make him DNR. We then talked outside the room to finish our conversation. Patient's wife asked to pursue comfort care including extubation and discontinuation of pressors. Chaplain was called. The above was discussed with Dr. Vassie LollAlva.   Karlene LinemanAlexa Jhovani Griswold, DO PGY-3 Internal Medicine Resident Pager # [phone] 10/05/2016 2:36 AM

## 2016-10-10 NOTE — Progress Notes (Signed)
Chaplain was paged for support of a dying Pt. Chaplain praued with Pt and family and discussed Pts life and loves. Chaplain played song as Pt transitioned Chaplain consoled family and escorted the to door.    01-01-2017 0300  Clinical Encounter Type  Visited With Patient and family together  Visit Type Patient actively dying  Referral From Nurse  Spiritual Encounters  Spiritual Needs Grief support  Stress Factors  Patient Stress Factors Loss  Family Stress Factors Loss

## 2016-10-10 NOTE — Progress Notes (Signed)
RT note:  Called to room by RN due to low SpO2.  Poor waveform on monitor at time of entry.  Upon changing pulse oximeter SpO2 dropped to 30% range with waveform.  BVM ventilations assumed until SpO2 read 100%.  Patient placed back on ventilator at 100%.  RR noted 40+ per minute.  ABG attempts unsuccessful.  Resident paged to bedside.  During event patient lost pulses and CPR was initiated.  Pulses quickly regained, but patient was made comfort care.  Discussed situation with resident and RN.  Normally, extubation would wait for morphine drip initiation, but it was felt that patient would not survive long enough for this to occur.  Sedation pushed by RN and patient extubated per comfort care orders.

## 2016-10-10 NOTE — Procedures (Signed)
Extubation Procedure Note  Patient Details:   Name: Michael Patel DOB: 1961/04/22 MRN: [id number]   Airway Documentation:  Airway 7.5 mm (Active)  Secured at (cm) 26 cm 09/30/2016 10:49 PM  Measured From Lips 09/30/2016 10:49 PM  Secured Location Center 09/30/2016 10:49 PM  Secured By [employer] 09/30/2016 10:49 PM  Site Condition Other (Comment) 09/30/2016 10:49 PM    Evaluation  O2 sats: Comfort Care Complications: No apparent complications Patient did tolerate procedure well. Bilateral Breath Sounds: Rhonchi     Extubated per comfort care order.  Family present at bedside.  [name] [name] 09/17/2016, 2:25 AM

## 2016-10-10 NOTE — Procedures (Signed)
Central Venous Catheter Insertion Procedure Note Michael Patel [id number] [birth year]  Procedure: Insertion of Central Venous Catheter Indications: Drug and/or fluid administration  Procedure Details Consent: Risks of procedure as well as the alternatives and risks of each were explained to the (patient/caregiver).  Consent for procedure obtained. Time Out: Verified patient identification, verified procedure, site/side was marked, verified correct patient position, special equipment/implants available, medications/allergies/relevent history reviewed, required imaging and test results available.  Performed  Maximum sterile technique was used including antiseptics, cap, gloves, gown, hand hygiene, mask and sheet. Skin prep: Chlorhexidine; local anesthetic administered A antimicrobial bonded/coated triple lumen catheter was placed in the right internal jugular vein using the Seldinger technique.  Evaluation Blood flow good Complications: No apparent complications Patient did tolerate procedure well. Chest X-ray ordered to verify placement.  CXR: pending.  Michael LinemanAlexa Khamiyah Grefe, DO PGY-3 Internal Medicine Resident Pager # [phone] 10/04/2016 12:30 AM

## 2016-10-10 NOTE — Discharge Summary (Signed)
  Name: Michael Patel MRN: [id number] DOB: November 18, 1960 56 y.o.  Date of Admission: 09/23/2016 11:39 PM Date of Discharge: 10-06-16 Attending Physician: No att. providers found  Discharge Diagnosis: Principal Problem:   Cardiac arrest [facility](HCC) Active Problems:   Community acquired pneumonia of left lower lobe of lung (HCC)   Elevated bilirubin   Thrombocytopenia   Hypokalemia   Somnolence   Seizure disorder (HCC)   Essential hypertension   Pressure injury of skin   Altered mental status   Ascites   Aspiration into airway   Septic shock (HCC)   Hepatic steatosis  Cause of death: Cardiac Arrest Time of death: [id number]  Disposition and follow-up:   Mr.[name].    Hospital Course: Mr. Tasia CatchingsCraig is a 56 yo male with PMHx of seizure disorder, alcohol abuse and hip replacement 2 years ago who was admitted on 1/17 with altered mental status, progressive weakness and found to be septic likely 2/2 to CAP. On 1/22, patient developed acute respiratory distress in the setting of encephalopathy and newly diagnosed ileus/SBO. Patient may have aspirated leading to cardiac arrest in PEA. Patient had ACLS performed for 18 minutes prior to obtaining ROSC. Patient became progressively more hypotensive requiring levophed and vasopressin. Despite this, patient continued to decline with hypotension, mottling, and increased RR. A discussion was had with family who firmly expressed that patient's wishes were to be DNR and not keep him intubated. Patient was later made DNR and transitioned to comfort care. Patient passed at 0245 on 09/10/16.   Signed: Karlene LinemanAlexa Burns, DO PGY-3 Internal Medicine Resident Pager # [phone] 10-06-16 6:12 AM

## 2016-10-10 NOTE — Progress Notes (Signed)
Time of death 720244.  Pronounced by Dan MakerJames Juriel Cid, RN and April Thompson, RN.  Dr. Vassie LollAlva notified.  Pt without respirations, heart rate/tones, or pulse.  Pupils fixed and dilated.  Family at bedside along with chaplain.

## 2016-10-10 DEATH — deceased

## 2017-02-07 NOTE — Progress Notes (Signed)
Entered into patient chart for code blue QI data.

## 2017-07-21 IMAGING — CR DG CHEST 1V PORT
1 series · 1 of 1 positions shown · non-contrast
Comparison: CT of the chest performed 09/29/2016

CLINICAL DATA: Endotracheal tube and orogastric tube placement.
Initial encounter.

EXAM:
PORTABLE CHEST 1 VIEW

[AP]
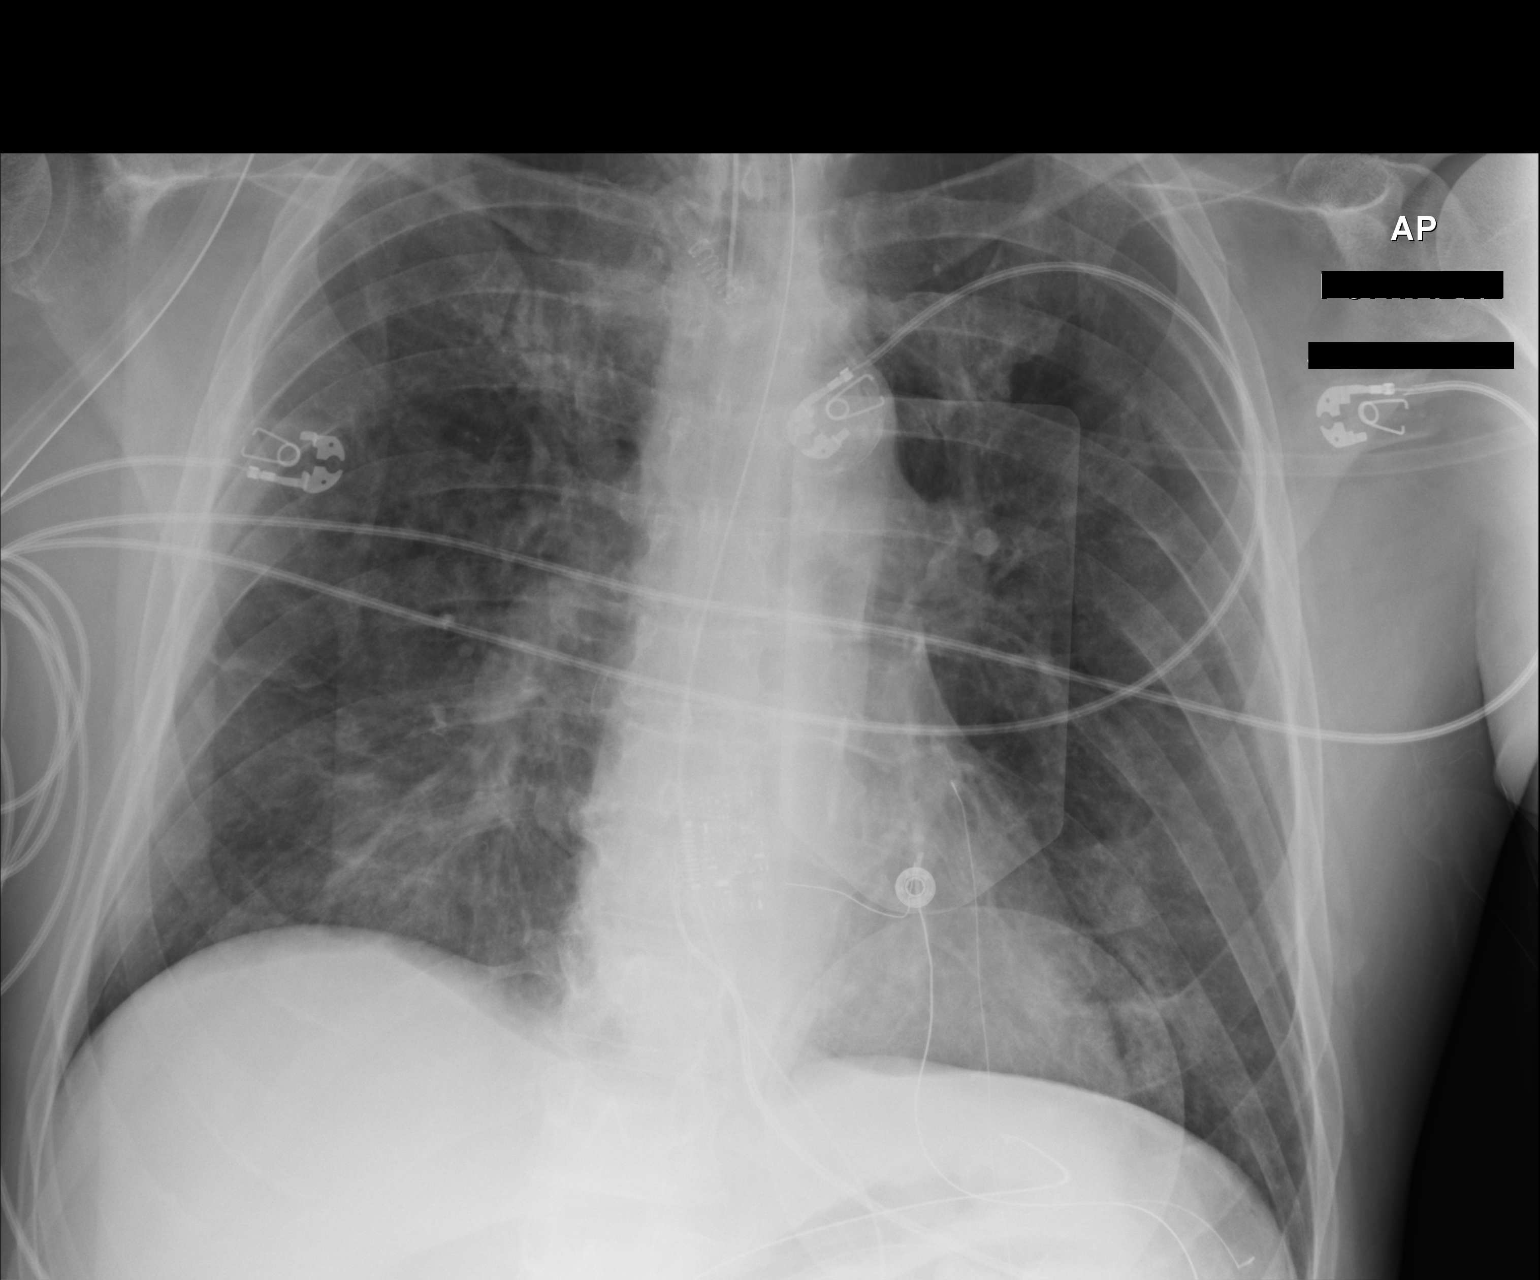

[1 of 1 positions shown; findings below may reference images not displayed]

FINDINGS: The endotracheal tube is seen ending 5 cm above the carina. An
enteric tube is noted extending below the diaphragm.

Vascular congestion is noted. Mild hazy bibasilar opacities could
reflect minimal interstitial edema. No pleural effusion or
pneumothorax is seen

The cardiomediastinal silhouette is normal in size. An external
pacing pad is noted. No acute osseous abnormalities are seen.
IMPRESSION: 1. Endotracheal tube seen ending 5 cm above the carina.
2. Enteric tube noted extending below the diaphragm.
3. Vascular congestion noted. Mild hazy bibasilar opacities could
reflect minimal interstitial edema.

## 2017-07-21 IMAGING — CR DG ABD PORTABLE 1V
1 series · 1 of 1 positions shown · non-contrast
Comparison: Abdominal radiograph performed 09/29/2014

CLINICAL DATA: Assess orogastric tube placement. Initial encounter.

EXAM:
PORTABLE ABDOMEN - 1 VIEW

[AP]
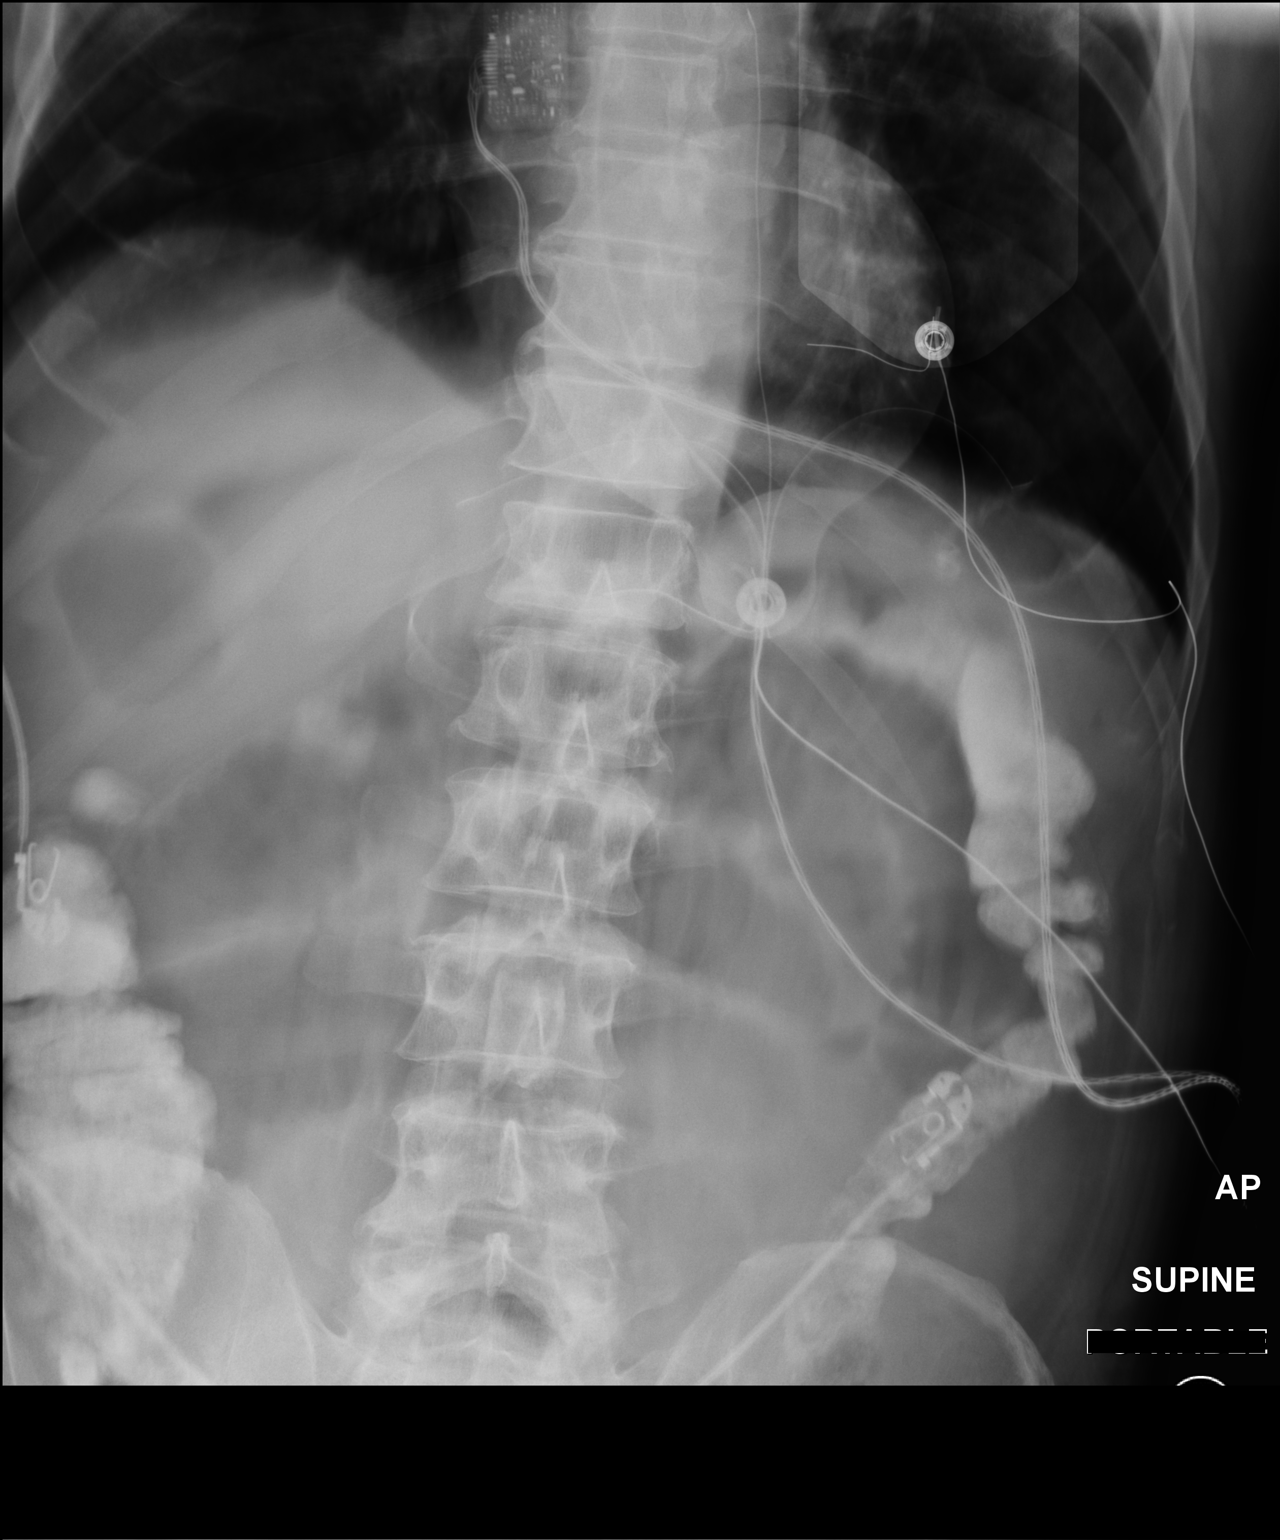

[1 of 1 positions shown; findings below may reference images not displayed]

FINDINGS: The patient's enteric tube is noted likely ending about the body of
the stomach, though difficult to fully assess due to motion
artifact.

Contrast is seen within the colon. Distention of small-bowel loops
raises concern for persistent partial small bowel obstruction. No
free intra-abdominal air is seen, though evaluation for free air is
limited on a single supine view. No acute osseous abnormalities are
seen.
IMPRESSION: 1. Enteric tube noted likely ending about the body of the stomach,
though difficult to fully assess due to motion artifact.
2. Distention of small-bowel loops raises concern for persistent
partial small obstruction. No free intra-abdominal air seen.
